# Patient Record
Sex: Female | Born: 1945
Health system: Southern US, Community
[De-identification: ages and names within clinical notes are randomized; demographics above are authoritative.]

## PROBLEM LIST (undated history)

## (undated) DIAGNOSIS — G4730 Sleep apnea, unspecified: Secondary | ICD-10-CM

## (undated) DIAGNOSIS — C4491 Basal cell carcinoma of skin, unspecified: Secondary | ICD-10-CM

## (undated) DIAGNOSIS — R112 Nausea with vomiting, unspecified: Secondary | ICD-10-CM

## (undated) DIAGNOSIS — L570 Actinic keratosis: Secondary | ICD-10-CM

## (undated) DIAGNOSIS — I471 Supraventricular tachycardia, unspecified: Secondary | ICD-10-CM

## (undated) DIAGNOSIS — Z9889 Other specified postprocedural states: Secondary | ICD-10-CM

## (undated) DIAGNOSIS — T753XXA Motion sickness, initial encounter: Secondary | ICD-10-CM

## (undated) DIAGNOSIS — I10 Essential (primary) hypertension: Secondary | ICD-10-CM

## (undated) HISTORY — DX: Actinic keratosis: L57.0

## (undated) HISTORY — DX: Basal cell carcinoma of skin, unspecified: C44.91

## (undated) HISTORY — PX: COLONOSCOPY: SHX174

## (undated) HISTORY — PX: HAND SURGERY: SHX662

## (undated) HISTORY — PX: FOOT SURGERY: SHX648

---

## 1999-11-20 ENCOUNTER — Encounter (INDEPENDENT_AMBULATORY_CARE_PROVIDER_SITE_OTHER): Payer: Self-pay

## 1999-11-20 ENCOUNTER — Other Ambulatory Visit: Admission: RE | Admit: 1999-11-20 | Discharge: 1999-11-20 | Payer: Self-pay | Admitting: Obstetrics and Gynecology

## 2008-03-09 ENCOUNTER — Ambulatory Visit: Payer: Self-pay | Admitting: Internal Medicine

## 2008-03-23 ENCOUNTER — Ambulatory Visit: Payer: Self-pay | Admitting: Internal Medicine

## 2010-06-25 NOTE — Miscellaneous (Signed)
Summary: [facility] Previsit/prep  Clinical Lists Changes  Medications: Added new medication of DULCOLAX 5 MG  TBEC (BISACODYL) Day before procedure take 2 at 3pm and 2 at 8pm. - Signed Added new medication of METOCLOPRAMIDE HCL 10 MG  TABS (METOCLOPRAMIDE HCL) As per prep instructions. - Signed Added new medication of MIRALAX   POWD (POLYETHYLENE GLYCOL 3350) As per prep  instructions. - Signed Rx of DULCOLAX 5 MG  TBEC (BISACODYL) Day before procedure take 2 at 3pm and 2 at 8pm.;  #4 x 0;  Signed;  Entered by: Wyona Almas RN;  Authorized by: Hart Carwin MD;  Method used: Electronically to [facility]*, [address] [place], [place], [place]  [address], Ph: [phone], Fax: [phone] Rx of METOCLOPRAMIDE HCL 10 MG  TABS (METOCLOPRAMIDE HCL) As per prep instructions.;  #2 x 0;  Signed;  Entered by: Wyona Almas RN;  Authorized by: Hart Carwin MD;  Method used: Electronically to [facility]*, [address] [place], [place], [place]  [address], Ph: [phone], Fax: [phone] Rx of MIRALAX   POWD (POLYETHYLENE GLYCOL 3350) As per prep  instructions.;  #255gm x 0;  Signed;  Entered by: Wyona Almas RN;  Authorized by: Hart Carwin MD;  Method used: Electronically to [facility]*, [address] [place], [place], [place]  [address], Ph: [phone], Fax: [phone] Allergies: Added new allergy or adverse reaction of PENICILLIN Observations: Added new observation of NKA: F (03/09/2008 14:53)    Prescriptions: MIRALAX   POWD (POLYETHYLENE GLYCOL 3350) As per prep  instructions.  #255gm x 0   Entered by:   Wyona Almas RN   Authorized by:   Hart Carwin MD   Signed by:   Wyona Almas RN on 03/09/2008   Method used:   Electronically to        [facility]* (retail)       [address]       [place], [place]  [address]       Ph: [phone]       Fax: [phone]   RxID:   [id number] METOCLOPRAMIDE HCL 10 MG  TABS  (METOCLOPRAMIDE HCL) As per prep instructions.  #2 x 0   Entered by:   Wyona Almas RN   Authorized by:   Hart Carwin MD   Signed by:   Wyona Almas RN on 03/09/2008   Method used:   Electronically to        [facility]* (retail)       [address]       [place], [place]  [address]       Ph: [phone]       Fax: [phone]   RxID:   [id number] DULCOLAX 5 MG  TBEC (BISACODYL) Day before procedure take 2 at 3pm and 2 at 8pm.  #4 x 0   Entered by:   Wyona Almas RN   Authorized by:   Hart Carwin MD   Signed by:   Wyona Almas RN on 03/09/2008   Method used:   Electronically to        [facility]* (retail)       [address]       [place], [place]  [address]       Ph: [phone]       Fax: [phone]   RxID:   [id number]

## 2012-05-27 ENCOUNTER — Emergency Department: Payer: Self-pay | Admitting: Emergency Medicine

## 2012-05-27 LAB — COMPREHENSIVE METABOLIC PANEL
Albumin: 4 g/dL (ref 3.4–5.0)
Anion Gap: 8 (ref 7–16)
Bilirubin,Total: 0.2 mg/dL (ref 0.2–1.0)
Calcium, Total: 8.6 mg/dL (ref 8.5–10.1)
Chloride: 107 mmol/L (ref 98–107)
Co2: 27 mmol/L (ref 21–32)
Creatinine: 0.76 mg/dL (ref 0.60–1.30)
Glucose: 99 mg/dL (ref 65–99)
Osmolality: 284 (ref 275–301)
SGOT(AST): 22 U/L (ref 15–37)
SGPT (ALT): 25 U/L (ref 12–78)
Sodium: 142 mmol/L (ref 136–145)

## 2012-05-27 LAB — TROPONIN I: Troponin-I: <0.02 ng/mL

## 2012-05-27 LAB — CBC
HCT: 42.2 % (ref 35.0–47.0)
HGB: 13.8 g/dL (ref 12.0–16.0)
MCHC: 32.8 g/dL (ref 32.0–36.0)
Platelet: 244 10*3/uL (ref 150–440)

## 2013-12-13 ENCOUNTER — Ambulatory Visit (INDEPENDENT_AMBULATORY_CARE_PROVIDER_SITE_OTHER): Payer: 59 | Admitting: Sports Medicine

## 2013-12-13 ENCOUNTER — Encounter: Payer: Self-pay | Admitting: Sports Medicine

## 2013-12-13 VITALS — BP 131/75 | HR 56 | Ht 68.0 in | Wt 150.0 lb

## 2013-12-13 DIAGNOSIS — S62609A Fracture of unspecified phalanx of unspecified finger, initial encounter for closed fracture: Secondary | ICD-10-CM

## 2013-12-13 DIAGNOSIS — M7542 Impingement syndrome of left shoulder: Secondary | ICD-10-CM

## 2013-12-13 DIAGNOSIS — M719 Bursopathy, unspecified: Secondary | ICD-10-CM

## 2013-12-13 DIAGNOSIS — M67919 Unspecified disorder of synovium and tendon, unspecified shoulder: Secondary | ICD-10-CM

## 2013-12-13 MED ORDER — NITROGLYCERIN 0.2 MG/HR TD PT24: MEDICATED_PATCH | TRANSDERMAL | Status: DC

## 2013-12-13 NOTE — Patient Instructions (Signed)

## 2013-12-13 NOTE — Progress Notes (Signed)
  Shelley Sims - 68 y.o. female MRN [id number]  Date of birth: 05/02/1946  SUBJECTIVE:  Including CC & ROS.  Patient is a 68 year old Caucasian female an avid tennis player who presents today complaining of chronic left shoulder pain. Patient reports a history that about 4 years ago she started having left shoulder pain. She was seen by orthopedic surgery who felt that she had a rotator cuff tendinitis put her in physical therapy. She continued aggressive home program with icing regularly which significantly improved her symptoms. However about 4 months in March another flareup of left shoulder pain started. She was seen by orthopedic surgery for reevaluated and they reported moderate osteoarthritis of the shoulder and a small tear was seen on an MRI of the left shoulder. At this time she was having pain at night. She rested by avoiding aggressive tennis and does feel that her pain is improved but this is still a nagging issue that she would like to have further management. Mentally patient would like to return to regular tennis at least 2-3 times a week  Also complicating patient's history she was resent at the beach week and suffered a fall while walking to the pool. She went down on her knees which reports has some bruising and discomfort and also fell on her hands and received some blunt trauma to her left index finger. She was seen in urgent care xray of her left knee was negative, and  the x-ray of her left hand revealed a distal phalanx intra-articular fracture. Patient was placed in a splint and recommended followup x-rays in fracture management   ROS: Review of systems otherwise negative except for information present in HPI  HISTORY: Past Medical, Surgical, Social, and Family History Reviewed & Updated per EMR. Pertinent Historical Findings include: Avid tennis player, straight hypertension, no history of migraine  DATA REVIEWED: Agents recent MRI of the left shoulder done in March 2015 revealed  small partial tear to the supraspinatus, no significant osteoarthritis  PHYSICAL EXAM:  VS: BP:131/75 mmHg  HR:56bpm  TEMP: ( )  RESP:   HT:5\' 8"  (172.7 cm)   WT:150 lb (68.04 kg)  BMI:22.9 PHYSICAL EXAM: SHOULDER EXAM:  General: well nourished Skin of UE: warm; dry, no rashes, lesions, ecchymosis or erythema. Vascular: radial pulses 2+ bilaterally Neurologically: Sensation to light touch upper extremities equal and intact bilaterally. Normal sensation with no sensory or motor defects in C4-C8 Palpation: no tenderness over the [facility] joint, acromion, no bicipital grove tenderness, mild supraspinatus tenderness at the anterior shoulder ROM active/passive: symmetric full 180 degree of abduction and forward flexion, symmetric internal (80-90) and external rotation (90) with shoulder at 90 abduction. Appley's scratch test equal bilaterally Strength testing: 5/5 symmetric strength in internal and external rotation, forward flexion, adduction and abduction     Special Test: positive Neer's, positive Hawkins, positive empty can, neg O'Brien, neg speeds, negative yergenson  MSK [place]: Left shoulder revealed normal biceps tendon, normal subscapularis, hypoechoic changes within the distal anterior fibers of the supraspinatus at the insertion, normal teres minor and infraspinatus. Articular cartilage surfaces appear smooth with no significant changes  ASSESSMENT & PLAN: See problem based charting & AVS for pt instructions.

## 2013-12-13 NOTE — Assessment & Plan Note (Signed)
Based on the patient's history of long-standing rotator cuff tendinitis and avid tennis playing she is at risk for rotator cuff tendinitis as well as possible tears. Based on her MRI, ultrasound and physical exam today there is evidence of possible impingement to the supraspinatus as well as evidence of hypoechoic changes on ultrasound at the distal anterior fibers as they insert on the humerus. She does not have any significant arthritic changes within the shoulder. Recommend conservative management with aggressive Jobbs protocol with range of motion and strengthening exercises. Will also treat patient with nitroglycerin patches to help with healing of this chronic tendinopathy. Patient will followup in 6 weeks in the office to reevaluate her shoulder strength and pain, as well as ultrasound patient to see if there is still hypoechoic changes within this supraspinatus

## 2013-12-13 NOTE — Assessment & Plan Note (Signed)
Unable to personally review the patient's x-ray of her left index finger that was done last week. Based on patient's x-ray reports she has a distal phalanx intra-articular fracture. Recommended continuing wearing the splint continuously for the next 3 weeks. We'll readmit his left hand at the beginning of August to determine if there is good healing quality and when we can discontinue the splint at that time. Will followup the patient over the phone concerning fracture management at that time the x-rays done

## 2014-01-06 ENCOUNTER — Ambulatory Visit
Admission: RE | Admit: 2014-01-06 | Discharge: 2014-01-06 | Disposition: A | Discharge status: Home / Self Care | Payer: Medicare Other | Source: Ambulatory Visit | Attending: Sports Medicine | Admitting: Sports Medicine

## 2014-01-06 DIAGNOSIS — S62609A Fracture of unspecified phalanx of unspecified finger, initial encounter for closed fracture: Secondary | ICD-10-CM

## 2014-01-25 ENCOUNTER — Encounter: Payer: Self-pay | Admitting: Sports Medicine

## 2014-01-25 ENCOUNTER — Ambulatory Visit (INDEPENDENT_AMBULATORY_CARE_PROVIDER_SITE_OTHER): Payer: Medicare Other | Admitting: Sports Medicine

## 2014-01-25 VITALS — BP 119/60 | Ht 68.0 in | Wt 150.0 lb

## 2014-01-25 DIAGNOSIS — M7542 Impingement syndrome of left shoulder: Secondary | ICD-10-CM

## 2014-01-25 DIAGNOSIS — M67919 Unspecified disorder of synovium and tendon, unspecified shoulder: Secondary | ICD-10-CM

## 2014-01-25 DIAGNOSIS — M62838 Other muscle spasm: Secondary | ICD-10-CM | POA: Insufficient documentation

## 2014-01-25 DIAGNOSIS — M719 Bursopathy, unspecified: Secondary | ICD-10-CM

## 2014-01-25 DIAGNOSIS — S62609A Fracture of unspecified phalanx of unspecified finger, initial encounter for closed fracture: Secondary | ICD-10-CM

## 2014-01-25 MED ORDER — CYCLOBENZAPRINE HCL 5 MG PO TABS: 5.0000 mg | ORAL_TABLET | Freq: Three times a day (TID) | ORAL | Status: DC | PRN

## 2014-01-25 NOTE — Progress Notes (Signed)
   Subjective:    Patient ID: Shelley Sims, female    DOB: 10/18/1945, 68 y.o.   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 For her shoulder pain, she was diagnosed with a supraspinatus tear 6 weeks ago, and has been using the nitroglycerin patch, and performing home exercise. Her shoulder symptoms have completely resolved today. She denies any aggravating factors. She has not yet returned to tennis activity. For the left index finger she notices significant decrease in her level of pain, and notes primarily just stiffness in the DIP joint and some associated residual swelling. She denies any numbness, tingling, or weakness. For the right midthoracic pain, onset of symptoms was approximately one week ago. She denies any known acute injury, and notes that it just started hurting when she woke up. Location is right paraspinal region in the mid thoracic spine. She denies any radiation of the symptoms. There is aggravated with bending her head down, twisting, or turning. Character of the pain is a pulling sensation. Symptoms are relieved with heat and ice. She denies any associated fevers, chills, numbness, tingling, or weakness.  Family history, social history, past medical history, medications, and allergies were reviewed in the EHR and are up-to-date.  Review of Systems As per history of present illness, 11 point review of systems was performed and is otherwise negative.    Objective:   Physical Exam BP 119/60  Ht 5\' 8"  (1.727 m)  Wt 150 lb (68.04 kg)  BMI 22.81 kg/m2 GEN: The patient is well-developed well-nourished female and in no acute distress.  She is awake alert and oriented x3. SKIN: warm and well-perfused, no rash  Neuro: Strength 5/5 globally. Sensation intact throughout. DTRs 2/4 bilaterally. No focal deficits. Vasc: +2 bilateral distal pulses. No edema.    MSK:  Examination of the left shoulder Full pain-free range of motion in all planes. Negative Hawkins test. She has good rotator cuff muscle strength. Negative Jobes test. Negative abdominal compression test. No tenderness over the a.c. joint. Negative speeds test.  Examination of the left index finger reveals some mild DIP swelling. Intrinsic flexion and extension is intact at the joint. She is no tenderness and minimal pain with passive flexion and extension. There is no overlying warmth or induration.  Examination of the thoracic spine reveals right paraspinal muscle hypertonicity with limited range of motion. No bony tenderness.  Limited musculoskeletal ultrasound: Limited musculoskeletal exam of the left shoulder was performed with views taken in the long and short axis. Examination of the patient's supraspinatus tendon reveals almost completely resolved intrinsic supraspinatus tear. There no fluid densities within the body of the supraspinatus muscle or tendon. Its insertion appears intact. The biceps, infraspinatus, and teres minor, and subscapularis appear normal.   Assessment & Plan:  1. left shoulder rotator cuff tear, improving -For the left shoulder, increase wt to 5lbs for exercises. Do 3 sets of 15 each exercise. -For the first 2 weeks, start hitting drills -Then for 4 weeks, continue to use caution with hitting serves to keep from over-doing it on the shoulder -Then at 6 weeks, discontinue nitroglycerin patch. - f/u prn  2. Right mid thoracic muscle spasm -Rx flexeril 5mg  po tid with precautions -Stretch, heating pad, follow-up with chiropracter tomorrow -Follow-up if persists  3. Left index finger DIP fracture -Improving -Continue stretching

## 2014-01-25 NOTE — Assessment & Plan Note (Signed)
-  For the left shoulder, increase wt to 5lbs for exercises. Do 3 sets of 15 each exercise. -For the first 2 weeks, start hitting drills -Then for 4 weeks, continue to use caution with hitting serves to keep from over-doing it on the shoulder -Then at 6 weeks, discontinue nitroglycerin patch. - f/u prn

## 2014-01-25 NOTE — Patient Instructions (Signed)
-  Take small dose of muscle relaxant as needed, starting at bedtime -Apply heat and stretch as needd.  -For the left shoulder, increase wt to 5lbs for exercises. Do 3 sets of 15 each exercise. -For the first 2 weeks, start hitting drills -Then for 4 weeks, continue to use caution with hitting serves to keep from over-doing it on the shoulder -Then at 6 weeks, discontinue nitroglycerin patch.  -Follow-up if needed

## 2014-01-25 NOTE — Assessment & Plan Note (Signed)
-  Improving  -Continue stretching

## 2014-01-25 NOTE — Assessment & Plan Note (Signed)
-  Rx flexeril 5mg  po tid with precautions -Stretch, heating pad, follow-up with chiropracter tomorrow -Follow-up if persists

## 2014-06-08 ENCOUNTER — Other Ambulatory Visit: Payer: Medicare Other | Admitting: Sports Medicine

## 2014-07-05 ENCOUNTER — Encounter: Payer: Self-pay | Admitting: Sports Medicine

## 2014-07-05 ENCOUNTER — Ambulatory Visit (INDEPENDENT_AMBULATORY_CARE_PROVIDER_SITE_OTHER): Payer: Medicare Other | Admitting: Sports Medicine

## 2014-07-05 VITALS — BP 125/81 | HR 60 | Ht 68.0 in | Wt 153.0 lb

## 2014-07-05 DIAGNOSIS — M75102 Unspecified rotator cuff tear or rupture of left shoulder, not specified as traumatic: Secondary | ICD-10-CM | POA: Diagnosis not present

## 2014-07-05 DIAGNOSIS — M7542 Impingement syndrome of left shoulder: Secondary | ICD-10-CM

## 2014-07-05 MED ORDER — NITROGLYCERIN 0.2 MG/HR TD PT24: MEDICATED_PATCH | TRANSDERMAL | Status: DC

## 2014-07-05 NOTE — Progress Notes (Signed)
   Subjective:    Patient ID: Shelley Sims, female    DOB: 1945-12-17, 69 y.o.   MRN: [id number]  HPI Patient is a 69 year old left-hand-dominant female an avid tennis player who presents today with recurrent left shoulder pain. About one year ago, she was seen by orthopedic surgery and they reported moderate osteoarthritis of the shoulder and a small tear was seen on an MRI of the left shoulder. We had been seeing her in July and September 2015 for follow-up of a small partial thickness supraspinatus tear in the left shoulder, treated with nitroglycerin and a home exercise program. Her symptoms had resolved at her follow-up appointment in September and the ultrasound appeared improved.  She read aggravated the left shoulder picking up luggage when she went on a trip to [place] and the [place] in October or November. She complains of intermittent left shoulder pain. She tried restarting the nitroglycerin patch for about 10 days, which was about a month ago. She has continued to play tennis 3 times per week. She denies any numbness, tingling, or weakness. Location of pain is primarily superior lateral left shoulder. She denies any pain that wakes her up at night. She is not taking any other medication for this.  Past medical history, social history, medications, and allergies were reviewed and are up to date in the chart. Review of Systems 7 point review of systems was performed and was otherwise negative unless noted in the history of present illness.     Objective:   Physical Exam BP 125/81 mmHg  Pulse 60  Ht 5\' 8"  (1.727 m)  Wt 153 lb (69.4 kg)  BMI 23.27 kg/m2 GEN: The patient is well-developed well-nourished female and in no acute distress.  She is awake alert and oriented x3. SKIN: warm and well-perfused, no rash  Neuro: Strength 5/5 globally. Sensation intact throughout. No focal deficits. Vasc: +2 bilateral distal pulses. No edema.  MSK: Examination of the back reveals no scapular  protraction with dynamic testing. She has full shoulder range of motion with minimal pain. Negative liftoff test. She has good rotator cuff strength. Slightly positive speed's test mainly just for pain. Negative Hawkins. Negative speeds. Negative Yergason's. No tenderness at the acromioclavicular joint. No apparent atrophy.  Limited musculoskeletal ultrasound: Long and short axis views were obtained of the left shoulder which is significant for a small partial thickness rotator cuff tear in the anterior portion of the supraspinatus involving approximately 15-20% of the foot plate. There is mild surrounding hypoechoic change, suggestive of fluid. The biceps, subscapularis, teres minor, and infraspinatus appear normal.    Assessment & Plan:  Please see problem based assessment and plan in the problem list.

## 2014-07-05 NOTE — Assessment & Plan Note (Signed)
-  Re-start NTG protocol -Modify serve to strike at 10:00 position rather than 12:00. Make sure to strike ball in front of your shoulder, not behind. -Re-start rotator cuff strengthening exercises -Plan follow-up in about 6-8 weeks if having persistent pain -Continue NTG patch for about 12 weeks, then may discontinue.

## 2014-09-26 ENCOUNTER — Ambulatory Visit: Payer: Self-pay | Admitting: Podiatry

## 2014-10-03 ENCOUNTER — Ambulatory Visit: Payer: Self-pay | Admitting: Podiatry

## 2014-11-22 ENCOUNTER — Ambulatory Visit: Payer: Medicare Other | Admitting: Sports Medicine

## 2014-11-22 ENCOUNTER — Ambulatory Visit (INDEPENDENT_AMBULATORY_CARE_PROVIDER_SITE_OTHER): Payer: Medicare Other | Admitting: Sports Medicine

## 2014-11-22 ENCOUNTER — Encounter: Payer: Self-pay | Admitting: Sports Medicine

## 2014-11-22 VITALS — BP 160/71 | HR 68 | Ht 68.0 in | Wt 150.0 lb

## 2014-11-22 DIAGNOSIS — R269 Unspecified abnormalities of gait and mobility: Secondary | ICD-10-CM | POA: Diagnosis not present

## 2014-11-22 DIAGNOSIS — M722 Plantar fascial fibromatosis: Secondary | ICD-10-CM | POA: Diagnosis not present

## 2014-11-22 DIAGNOSIS — M533 Sacrococcygeal disorders, not elsewhere classified: Secondary | ICD-10-CM | POA: Diagnosis not present

## 2014-11-22 NOTE — Patient Instructions (Signed)
Recommend working with SI joint with rehab exercises. -Pilates rehabilitation to work on mobilizing right SI joint. -Pelvic rotation  -Hip abductor strengthening  1. Hip abductor strengthening: Laying on side with leg straight, raise leg up and back down, 10-15 x 3 sets once daily, both sides  2. Crossover stretch: Pull right leg over left laying on your back, hold 5-10 seconds, breath, relax, repeat 5 times daily  3. Laying on your back, pull knees to chest, breath, hold 5 seconds, repeat 3 times  4. Standing hip rotation exercises: hands on hips, flex right hip to 90 degrees, rotate hip out, bring back to neutral. Repeat 8-10 x 3 sets.  Follow-up in a month or two to track progress and plan on bringing insoles with athletic shoes to look at inserts.

## 2014-11-22 NOTE — Progress Notes (Signed)
   Subjective:    Patient ID: Shelley Sims, female    DOB: [birth year], 69 y.o.   MRN: [id number]  HPI Shelley Sims is a 69 year old female who presents for evaluation of right-sided low back pain and right knee pain. Onset was 6 months ago without any known acute injury. She is fairly active with playing tennis. She is left-handed. Location of her pain is located directly over the right sacroiliac joint. She feels that sometimes this pain radiates down the posterolateral right thigh to the lateral right knee. Symptoms are aggravated with playing tennis or other activities. She has not tried any relieving factors. Her pain occasionally irritates her at night. Location of the knee pain is primarily the lateral right knee. She notes associated intermittent crepitus with pain. She denies any swelling, locking, or giving way. She feels that the right knee has a weak sensation to it. She denies any pain that radiates past the knee. She denies any loss of bladder bowel or saddle anesthesia.  Recall that she has a somewhat rather complex foot history with prior visits to [facility]. See previous documentation for more detail. Unfortunately, she did not bring in her athletic shoes and orthotics today.  Past medical history, social history, medications, and allergies were reviewed and are up to date in the chart.  Review of Systems 7 point review of systems was performed and was otherwise negative unless noted in the history of present illness.     Objective:   Physical Exam BP 160/71 mmHg  Pulse 68  Ht 5\' 8"  (1.727 m)  Wt 150 lb (68.04 kg)  BMI 22.81 kg/m2 GEN: The patient is well-developed well-nourished female and in no acute distress.  She is awake alert and oriented x3. SKIN: warm and well-perfused, no rash  EXTR: No lower extremity edema or calf tenderness Neuro: Strength 5/5 globally. Sensation intact throughout. DTRs 2/4 bilaterally. No focal deficits. Vasc: +2 bilateral distal pulses. No edema.  MSK:  Examination of the lumbar spine reveals grossly full range of motion. She points to an area of tenderness around the right SI joint. This is tender to palpation. She has decreased mobility at the joint. Negative seated straight leg raise test. Examination of the right knee reveals no effusion. No overlying erythema or induration. Full range of motion. Tender to palpation along the distal IT band. Range of motion with mild crepitus without pain. No valgus varus instability. She has weakness of the bilateral gluteus medius hip abductors. Gait analysis reveals that she walks with a slightly greater right-sided Trendelenburg compared to left. No leg length discrepancy. Examination of the bilateral feet reveals pes cavus, worse on the right. She has a midfoot varus on the right. She has splaying between the bilateral first and second toes.     Assessment & Plan:  Please see problem based assessment and plan in the problem list.

## 2014-11-22 NOTE — Assessment & Plan Note (Addendum)
-  Stretches, hip strengthening -Pilates rehabilitation -Follow-up if needed

## 2014-11-22 NOTE — Assessment & Plan Note (Addendum)
-  Tried CST injection in the past without success -Has insoles -Try manual massage to break up adhesions We'll do some research if Botox injections have yielded some good results.

## 2014-11-22 NOTE — Assessment & Plan Note (Signed)
-  Hip abductor strengthening -Piriformis stretches and gapping at the SI joint -We'll plan on seeing her back to evaluate her orthotics at some point

## 2015-01-09 ENCOUNTER — Ambulatory Visit (INDEPENDENT_AMBULATORY_CARE_PROVIDER_SITE_OTHER): Payer: Medicare Other | Admitting: Podiatry

## 2015-01-09 ENCOUNTER — Encounter: Payer: Self-pay | Admitting: Podiatry

## 2015-01-09 ENCOUNTER — Ambulatory Visit (INDEPENDENT_AMBULATORY_CARE_PROVIDER_SITE_OTHER): Payer: Medicare Other

## 2015-01-09 VITALS — BP 136/78 | HR 72 | Resp 18

## 2015-01-09 DIAGNOSIS — R52 Pain, unspecified: Secondary | ICD-10-CM

## 2015-01-09 DIAGNOSIS — M722 Plantar fascial fibromatosis: Secondary | ICD-10-CM | POA: Diagnosis not present

## 2015-01-09 NOTE — Progress Notes (Signed)
   Subjective:    Patient ID: Shelley Sims, female    DOB: 05-15-1946, 69 y.o.   MRN: [id number]  HPI  69 year old female presents the office with complaints of multiple knots the bottoms of feet placement ongoing for approximately 35 years. She states that she feels had surgery on the right foot although the knots didn't recur. She has some occasional discomfort to them after being on them for quite some time and see some redness she is on them for a long time as well. She states they have been about the same size over the last several years then made growing slowly. She previously had orthotics, injections. She states that she does have any relief after the injections. No other complaints at this time. Review of Systems  All other systems reviewed and are negative.      Objective:   Physical Exam AAO x3, NAD DP/PT pulses palpable bilaterally, CRT less than 3 seconds Protective sensation intact with Simms Weinstein monofilament, vibratory sensation intact, Achilles tendon reflex intact On the plantar aspect of bilateral feet there are multiple, firm, nonmobile nodules within the medial band of the plantar fascia. On the left foot there is multiple nodules one and medial forefoot mild medial arch on the right foot on the medial arch. There is no overlying skin change. There is no tenderness at this time to palpation of lesions. No other areas of tenderness to bilateral lower extremities. MMT 5/5, ROM WNL.  No open lesions or pre-ulcerative lesions.  No overlying edema, erythema, increase in warmth to bilateral lower extremities.  No pain with calf compression, swelling, warmth, erythema bilaterally.      Assessment & Plan:  69 year old female with multiple bilateral plantar fibromas -X-rays were obtained and reviewed with the patient.  -Treatment options discussed including all alternatives, risks, and complications -At this time discussed various conservative treatment options. She elected  to proceed with verapamil cream. This is ordered through a compound pharmacy. Application instructions in side effects were discussed the patient. -Also discussed new orthotics. She'll try the cream to see this helps before buying new orthotics. -Follow-up in 3 months or sooner if any problems arise. In the meantime, encouraged to call the office with any questions, concerns, change in symptoms.   Celesta Gentile, DPM

## 2015-03-08 ENCOUNTER — Ambulatory Visit (INDEPENDENT_AMBULATORY_CARE_PROVIDER_SITE_OTHER): Payer: Medicare Other | Admitting: Podiatry

## 2015-03-08 ENCOUNTER — Encounter: Payer: Self-pay | Admitting: Podiatry

## 2015-03-08 VITALS — BP 149/75 | HR 65 | Resp 18

## 2015-03-08 DIAGNOSIS — M722 Plantar fascial fibromatosis: Secondary | ICD-10-CM | POA: Diagnosis not present

## 2015-03-08 DIAGNOSIS — M7662 Achilles tendinitis, left leg: Secondary | ICD-10-CM | POA: Diagnosis not present

## 2015-03-08 NOTE — Patient Instructions (Signed)

## 2015-03-10 NOTE — Progress Notes (Signed)
Patient ID: Shelley Sims, female   DOB: Aug 05, 1945, 69 y.o.   MRN: [id number]  Subjective: Patient presents the office they follow up evaluation of plantar fibromas. Also she states that she forgot to mention her last appointment that she was having some tenderness of the back of her left heel which started a couple months ago. She says the pain has gotten better although she has pain with prolonged ambulation or weightbearing. She stated the area feels tight and she stretches the area out. She continues with the compound cream to the growths on the bottom of the feet and she believes that it is working. She denies any redness or swelling. Denies any recent injury or trauma to bilateral lower extremities. No tingling or numbness. No other complaints at this time.  Objective:  AAO 3, NAD DP/PT pulses palpable 2/4, CRT less than 3 seconds Protective sensation intact with Simms Weinstein monofilament On the plantar aspect of the bilateral feet are multiple, firm, not mobile nodules along the medial band of the plantar fascia. The nodules do appear to be smaller sized they appear to be more supple. On the left posterior heel there is tenderness to palpation on the distal aspect of the Achilles tendon and along the posterior aspect of the calcaneus. There is no defect noted in Spaulding test is negative within the Achilles tendon. There is no pain with lateral compression of the calcaneus. Equinus is present. There is no overlying edema, erythema, increase in warmth. No open lesions or pre-ulcer lesions. No pain with calf compression, swelling, warmth, erythema.  Assessment: 69 year old female with left Achilles tendinitis, plantar fibromatosis  Plan: -Treatment options discussed including all alternatives, risks, and complications -Continue with compound cream/verapamil cream to the plantar fibromas. -Stretching and rehabilitation exercises for Achilles tendinitis were dispensed and discussed today. Ice  to the area. I discussed night splint she states that she will purchase one over-the-counter due to cost. Continue supportive shoe gear. -Follow-up as scheduled or sooner if any problems are to arise. Call any questions or concerns or any changes symptoms in the meantime.  Celesta Gentile, DPM

## 2015-04-12 ENCOUNTER — Ambulatory Visit: Payer: Medicare Other | Admitting: Podiatry

## 2015-05-30 DIAGNOSIS — R05 Cough: Secondary | ICD-10-CM | POA: Diagnosis not present

## 2015-05-30 DIAGNOSIS — J029 Acute pharyngitis, unspecified: Secondary | ICD-10-CM | POA: Diagnosis not present

## 2015-06-12 ENCOUNTER — Ambulatory Visit: Payer: Medicare Other | Admitting: Podiatry

## 2015-07-25 ENCOUNTER — Ambulatory Visit (INDEPENDENT_AMBULATORY_CARE_PROVIDER_SITE_OTHER): Payer: PPO | Admitting: Sports Medicine

## 2015-07-25 ENCOUNTER — Encounter: Payer: Self-pay | Admitting: Sports Medicine

## 2015-07-25 VITALS — BP 141/79 | HR 66 | Ht 67.0 in | Wt 154.0 lb

## 2015-07-25 DIAGNOSIS — M722 Plantar fascial fibromatosis: Secondary | ICD-10-CM

## 2015-07-25 DIAGNOSIS — M7542 Impingement syndrome of left shoulder: Secondary | ICD-10-CM

## 2015-07-25 DIAGNOSIS — M75102 Unspecified rotator cuff tear or rupture of left shoulder, not specified as traumatic: Secondary | ICD-10-CM

## 2015-07-26 NOTE — Progress Notes (Signed)
   Subjective:    Patient ID: Shelley Sims, female    DOB: Feb 11, 1946, 70 y.o.   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  - Over last few months has having increasing pain again.  - Tried NTG, 1/2 patch,  for 4-5 days with good improvement in pain. - Only able to play every other day. - Takes an occasional IBU.  - No falls or other trauma.   - No fevers, chills, or night sweats.    Plantar Fibromatosis:  - Chronic - Usually doesn't bother her much when walking/playing tennis.  - Sore after working out usually.  - Tried breaking up adhesions with 'spikey ball', but very painful.  - Injections and surgery in the past.  - Currently usuing rigid orthotics that she has had for 5+ years.  - Does have a cream of baclofen, gabapentin, diclofenac that potentially makes things better.    Family history, social history, past medical history, medications, and allergies were reviewed in the EHR and are up-to-date.  Review of Systems As per history of present illness, 11 point review of systems was performed and is otherwise negative.    Objective:   Physical Exam BP 141/79 mmHg  Pulse 66  Ht 5\' 7"  (1.702 m)  Wt 154 lb (69.854 kg)  BMI 24.11 kg/m2 GEN: The patient is well-developed well-nourished female and in no acute distress.  She is awake alert and oriented x3. SKIN: warm and well-perfused, no rash  Neuro: Strength 5/5 globally. Sensation intact throughout. DTRs 2/4 bilaterally. No focal deficits. Vasc: +2 bilateral distal pulses. No edema.  MSK:  Examination of the left shoulder Full pain-free range of motion in all planes. + Hawkins test. She has good rotator cuff muscle strength. Negative Jobes test. Negative abdominal compression test. No tenderness over the a.c. joint. Negative speeds test.  Foot exam: -  Numerous plantar fibromas b/l.  - b/l pes cavus, R>L - Moderate transvers arch collapse  Limited musculoskeletal ultrasound: Limited musculoskeletal exam of the left shoulder was performed with views taken in the long and short axis. Examination of the patient's supraspinatus tendon reveals a persistnet articular surface irredularity and early calcific tendinopathy on the interval view.  Its insertion appears intact. The biceps, infraspinatus, and teres minor, and subscapularis appear normal. C/W chronic supra impingement and tendinopathy   Assessment & Plan:  1. left shoulder rotator cuff Impingement/tendinopathy.  - Continue with RC strengthening - NTG PRN. - Change serve position to 10 o'clock from noon to reduce supra impingement.  - f/u prn, 6-8 weeks if not improving.   2. B/l Plantar fibromas: rigid orthotic.  Will have her return for orthotic construction as the softer material will hopefully feel better for her. If providing some relief, continue with spikey ball, stretching, ice water baths post workout. Can consider injection as well, which can help decrease size.

## 2015-08-15 ENCOUNTER — Encounter: Payer: PPO | Admitting: Sports Medicine

## 2015-08-21 DIAGNOSIS — Z1231 Encounter for screening mammogram for malignant neoplasm of breast: Secondary | ICD-10-CM | POA: Diagnosis not present

## 2015-09-17 DIAGNOSIS — L400 Psoriasis vulgaris: Secondary | ICD-10-CM | POA: Diagnosis not present

## 2015-09-17 DIAGNOSIS — Z1283 Encounter for screening for malignant neoplasm of skin: Secondary | ICD-10-CM | POA: Diagnosis not present

## 2015-09-17 DIAGNOSIS — D485 Neoplasm of uncertain behavior of skin: Secondary | ICD-10-CM | POA: Diagnosis not present

## 2015-09-17 DIAGNOSIS — L821 Other seborrheic keratosis: Secondary | ICD-10-CM | POA: Diagnosis not present

## 2015-09-17 DIAGNOSIS — L812 Freckles: Secondary | ICD-10-CM | POA: Diagnosis not present

## 2015-09-17 DIAGNOSIS — L578 Other skin changes due to chronic exposure to nonionizing radiation: Secondary | ICD-10-CM | POA: Diagnosis not present

## 2015-09-17 DIAGNOSIS — L918 Other hypertrophic disorders of the skin: Secondary | ICD-10-CM | POA: Diagnosis not present

## 2015-09-17 DIAGNOSIS — D229 Melanocytic nevi, unspecified: Secondary | ICD-10-CM | POA: Diagnosis not present

## 2015-09-17 DIAGNOSIS — D1801 Hemangioma of skin and subcutaneous tissue: Secondary | ICD-10-CM | POA: Diagnosis not present

## 2015-09-17 DIAGNOSIS — Z808 Family history of malignant neoplasm of other organs or systems: Secondary | ICD-10-CM | POA: Diagnosis not present

## 2015-09-21 DIAGNOSIS — E785 Hyperlipidemia, unspecified: Secondary | ICD-10-CM | POA: Diagnosis not present

## 2015-09-21 DIAGNOSIS — E559 Vitamin D deficiency, unspecified: Secondary | ICD-10-CM | POA: Diagnosis not present

## 2015-09-27 DIAGNOSIS — R8299 Other abnormal findings in urine: Secondary | ICD-10-CM | POA: Diagnosis not present

## 2015-09-27 DIAGNOSIS — Z23 Encounter for immunization: Secondary | ICD-10-CM | POA: Diagnosis not present

## 2015-09-27 DIAGNOSIS — I10 Essential (primary) hypertension: Secondary | ICD-10-CM | POA: Diagnosis not present

## 2015-09-27 DIAGNOSIS — Z0000 Encounter for general adult medical examination without abnormal findings: Secondary | ICD-10-CM | POA: Diagnosis not present

## 2015-09-27 DIAGNOSIS — E559 Vitamin D deficiency, unspecified: Secondary | ICD-10-CM | POA: Diagnosis not present

## 2015-10-01 DIAGNOSIS — R8299 Other abnormal findings in urine: Secondary | ICD-10-CM | POA: Diagnosis not present

## 2015-10-01 DIAGNOSIS — Z0000 Encounter for general adult medical examination without abnormal findings: Secondary | ICD-10-CM | POA: Diagnosis not present

## 2015-10-01 DIAGNOSIS — E559 Vitamin D deficiency, unspecified: Secondary | ICD-10-CM | POA: Diagnosis not present

## 2015-12-11 DIAGNOSIS — K644 Residual hemorrhoidal skin tags: Secondary | ICD-10-CM | POA: Diagnosis not present

## 2016-02-06 DIAGNOSIS — H2513 Age-related nuclear cataract, bilateral: Secondary | ICD-10-CM | POA: Diagnosis not present

## 2016-02-06 DIAGNOSIS — D3132 Benign neoplasm of left choroid: Secondary | ICD-10-CM | POA: Diagnosis not present

## 2016-03-10 DIAGNOSIS — L812 Freckles: Secondary | ICD-10-CM | POA: Diagnosis not present

## 2016-03-10 DIAGNOSIS — L309 Dermatitis, unspecified: Secondary | ICD-10-CM | POA: Diagnosis not present

## 2016-03-10 DIAGNOSIS — L858 Other specified epidermal thickening: Secondary | ICD-10-CM | POA: Diagnosis not present

## 2016-03-10 DIAGNOSIS — L570 Actinic keratosis: Secondary | ICD-10-CM | POA: Diagnosis not present

## 2016-06-05 DIAGNOSIS — L820 Inflamed seborrheic keratosis: Secondary | ICD-10-CM | POA: Diagnosis not present

## 2016-06-05 DIAGNOSIS — L578 Other skin changes due to chronic exposure to nonionizing radiation: Secondary | ICD-10-CM | POA: Diagnosis not present

## 2016-06-05 DIAGNOSIS — L821 Other seborrheic keratosis: Secondary | ICD-10-CM | POA: Diagnosis not present

## 2016-06-05 DIAGNOSIS — L570 Actinic keratosis: Secondary | ICD-10-CM | POA: Diagnosis not present

## 2016-06-13 DIAGNOSIS — I10 Essential (primary) hypertension: Secondary | ICD-10-CM | POA: Diagnosis not present

## 2016-06-13 DIAGNOSIS — J069 Acute upper respiratory infection, unspecified: Secondary | ICD-10-CM | POA: Diagnosis not present

## 2016-06-17 DIAGNOSIS — J209 Acute bronchitis, unspecified: Secondary | ICD-10-CM | POA: Diagnosis not present

## 2016-08-06 DIAGNOSIS — J0100 Acute maxillary sinusitis, unspecified: Secondary | ICD-10-CM | POA: Diagnosis not present

## 2016-08-27 DIAGNOSIS — Z1231 Encounter for screening mammogram for malignant neoplasm of breast: Secondary | ICD-10-CM | POA: Diagnosis not present

## 2016-08-27 DIAGNOSIS — M8589 Other specified disorders of bone density and structure, multiple sites: Secondary | ICD-10-CM | POA: Diagnosis not present

## 2016-10-27 ENCOUNTER — Other Ambulatory Visit: Payer: Self-pay | Admitting: *Deleted

## 2016-10-27 DIAGNOSIS — M7542 Impingement syndrome of left shoulder: Secondary | ICD-10-CM

## 2016-10-27 MED ORDER — NITROGLYCERIN 0.2 MG/HR TD PT24: MEDICATED_PATCH | TRANSDERMAL | 0 refills | Status: DC

## 2016-11-17 ENCOUNTER — Encounter: Payer: Self-pay | Admitting: Sports Medicine

## 2016-11-17 ENCOUNTER — Ambulatory Visit: Payer: Self-pay

## 2016-11-17 ENCOUNTER — Ambulatory Visit (INDEPENDENT_AMBULATORY_CARE_PROVIDER_SITE_OTHER): Payer: PPO | Admitting: Sports Medicine

## 2016-11-17 VITALS — BP 120/80 | Ht 68.0 in | Wt 152.0 lb

## 2016-11-17 DIAGNOSIS — S46812A Strain of other muscles, fascia and tendons at shoulder and upper arm level, left arm, initial encounter: Secondary | ICD-10-CM | POA: Diagnosis not present

## 2016-11-17 DIAGNOSIS — M75102 Unspecified rotator cuff tear or rupture of left shoulder, not specified as traumatic: Secondary | ICD-10-CM | POA: Insufficient documentation

## 2016-11-17 DIAGNOSIS — M25512 Pain in left shoulder: Secondary | ICD-10-CM

## 2016-11-17 DIAGNOSIS — M19012 Primary osteoarthritis, left shoulder: Secondary | ICD-10-CM | POA: Insufficient documentation

## 2016-11-17 DIAGNOSIS — IMO0001 Reserved for inherently not codable concepts without codable children: Secondary | ICD-10-CM

## 2016-11-17 NOTE — Progress Notes (Signed)
   Subjective:    Patient ID: Shelley Sims, female    DOB: Aug 13, 1945, 71 y.o.   MRN: [id number]  HPI [name] is a 71 year old female who presents for left shoulder pain. She reports of chronic shoulder issues for the past several years with intermittent flares. This current flare started about 2.5 weeks ago. She denies any specific injury but reports recent overuse with playing tennis more often, some weight lifting at the gym, and playing golf. She entire shoulder hurts (anterior, lateral, posterior) but pain does not radiate. Denies any numbness or tingling to the left arm. Denies any neck pain. Reports sometimes her shoulder "pops" but does not have pain with this. She has iced the area, using nitroglycerin patches, and doing piliates which does help. Her symptoms have improved over the past 2.5 weeks but her shoulder stiff. She is wondering if it is okay for her to use nitroglycerin patches intermittently and if she is further injuring the area with her activity.     2015 RCT [place] - healed with HEP/ NTG 2016 - similar problem   Review of Systems No neck pain. Right shoulder pain without radiation of the pain No numbness or tingling of the left arm  No fevers, chills, unintentional weight loss, or night sweats    Objective:   Physical Exam Gen: NAD BP 120/80   Ht 5\' 8"  (1.727 m)   Wt 152 lb (68.9 kg)   BMI 23.11 kg/m   Neck:  Normal ROM including flexion, extension, lateral rotation and side bend without discomfort   Left Shoulder:  Normal range of motion without any pain/discomfort except for decreased internal rotation compared to the right shoulder  No tenderness to palpation of the structures  5/5 strength of abduction, flexion, extension, internal and external rotation  Normal sensation to light touch Pain with empty can test  Obrien test: more pain with forearm supinated.  Pain with Hawkins test    Ultrasound of Shoulder-Left  BT short-micro-calcifications but  no swelling BT long-normal Supraspinatus tendon- 0.5cm distal hypoechoic change consistent with distal tear at foot plate.  Note there are some calcific changes in mid tendon consistent with old tear that has healed. Subscapularis tendon-normal Infraspinatus tendon- Normal Teres Minor tendon- normal AC joint - mild increase in hypoechoic swelling but minimal arthritis  Summary and Additional findings- Distal partial tera of supraspinatus tendon which on dynamic motion does not retract.  Ultrasound and interpretation by Wolfgang Phoenix. Priest Lockridge, MD     Assessment & Plan:  Partial Tear of the Supraspinatus Tendon:  Shoulder strengthening exercises given and reviewed with patient  Follow up in 6-8 weeks.  Smiley Houseman, MD PGY 2 Family Medicine   I observed and examined the patient with the resident and agree with assessment and plan.  Note reviewed and modified by me. Stefanie Libel, MD

## 2016-11-17 NOTE — Assessment & Plan Note (Signed)
HEP to emphasize spokes of wheel Tennis motion with dumbells  NTG protocol - this has helped her in past Probably use 12 week course  Re [place] in 6 weeks before playing

## 2016-12-05 DIAGNOSIS — H5231 Anisometropia: Secondary | ICD-10-CM | POA: Diagnosis not present

## 2016-12-05 DIAGNOSIS — H2513 Age-related nuclear cataract, bilateral: Secondary | ICD-10-CM | POA: Diagnosis not present

## 2016-12-05 DIAGNOSIS — D3132 Benign neoplasm of left choroid: Secondary | ICD-10-CM | POA: Diagnosis not present

## 2016-12-29 DIAGNOSIS — D229 Melanocytic nevi, unspecified: Secondary | ICD-10-CM | POA: Diagnosis not present

## 2016-12-29 DIAGNOSIS — R234 Changes in skin texture: Secondary | ICD-10-CM | POA: Diagnosis not present

## 2016-12-29 DIAGNOSIS — L812 Freckles: Secondary | ICD-10-CM | POA: Diagnosis not present

## 2016-12-29 DIAGNOSIS — L821 Other seborrheic keratosis: Secondary | ICD-10-CM | POA: Diagnosis not present

## 2016-12-29 DIAGNOSIS — L578 Other skin changes due to chronic exposure to nonionizing radiation: Secondary | ICD-10-CM | POA: Diagnosis not present

## 2016-12-29 DIAGNOSIS — Z1283 Encounter for screening for malignant neoplasm of skin: Secondary | ICD-10-CM | POA: Diagnosis not present

## 2016-12-29 DIAGNOSIS — D485 Neoplasm of uncertain behavior of skin: Secondary | ICD-10-CM | POA: Diagnosis not present

## 2016-12-29 DIAGNOSIS — D692 Other nonthrombocytopenic purpura: Secondary | ICD-10-CM | POA: Diagnosis not present

## 2016-12-29 DIAGNOSIS — D1800 Hemangioma unspecified site: Secondary | ICD-10-CM | POA: Diagnosis not present

## 2016-12-29 DIAGNOSIS — M2550 Pain in unspecified joint: Secondary | ICD-10-CM | POA: Diagnosis not present

## 2016-12-30 ENCOUNTER — Ambulatory Visit (INDEPENDENT_AMBULATORY_CARE_PROVIDER_SITE_OTHER): Payer: PPO | Admitting: Sports Medicine

## 2016-12-30 DIAGNOSIS — S46812A Strain of other muscles, fascia and tendons at shoulder and upper arm level, left arm, initial encounter: Secondary | ICD-10-CM | POA: Diagnosis not present

## 2016-12-30 DIAGNOSIS — IMO0001 Reserved for inherently not codable concepts without codable children: Secondary | ICD-10-CM

## 2016-12-30 NOTE — Assessment & Plan Note (Signed)
Ultrasound today shows healing supraspinatus tear.  Improvement in pain noted.  -Continue nitroglycerin patches and shoulder strengthening exercises with 3 lb dumbells.  -May resume light tennis -Follow up in 2 months and repeat U/S at that time

## 2016-12-30 NOTE — Progress Notes (Signed)
CC: follow up L shoulder pain   HPI:  Patient presents for 6 week follow up for left shoulder pain.  Reports chronic shoulder pain over several years which bothers her intermittently.  She was seen on 6/25 and ultrasound at that time revealed a 0.5 cm supraspinatus tear.  She has since then been doing shoulder strengthening exercises with 2 lb dumbells as well as pilates which helps.  She notes activity is not painful however she reports a painless popping of the shoulder.  Denies any numbness or tingling. She does not note swelling of the joint.   Her ROM is not limited.  She has been using Nitroglycerin patches as instructed which have helped with her pain.  She has not been icing it often or taking anti-inflammatories.    ROS: No swelling.  No numbness/tingling.   Physical exam:  71 yo female, NAD BP 120/80   Ht 5\' 8"  (1.727 m)   Wt 150 lb (68 kg)   BMI 22.81 kg/m   Left Shoulder: Inspection reveals no abnormalities, atrophy or asymmetry.  Palpation is normal with no tenderness over AC joint or bicipital groove.  ROM is full in all planes. 5/5 strength with abduction, flexion, extension, internal and external rotation  Rotator cuff strength normal throughout.  No signs of impingement with negative Neer and Hawkin's tests, negative empty can test. Speeds and Yergason's tests normal.   This is improved exam as last visit the impingement tests were +  Ultrasound of left Shoulder/ limited  Supraspinatus - hypoechoic care at foot plate is improved in appearance; less retraction of fibers.  Very distal attachment still appears to have small separation.  Ultrasound and interpretation by Wolfgang Phoenix. Walaa Carel, MD    A/P: Ultrasound today shows healing supraspinatus tear.  Improvement in pain noted.  -Continue nitroglycerin patches and shoulder strengthening exercises with 3 lb dumbells.  -May resume light tennis -Follow up in 2 months and repeat U/S at that time   I observed and examined  the patient with the resident and agree with assessment and plan.  Note reviewed and modified by me. Stefanie Libel, MD

## 2017-01-01 DIAGNOSIS — E559 Vitamin D deficiency, unspecified: Secondary | ICD-10-CM | POA: Diagnosis not present

## 2017-01-01 DIAGNOSIS — I10 Essential (primary) hypertension: Secondary | ICD-10-CM | POA: Diagnosis not present

## 2017-01-05 DIAGNOSIS — I10 Essential (primary) hypertension: Secondary | ICD-10-CM | POA: Diagnosis not present

## 2017-01-05 DIAGNOSIS — M79642 Pain in left hand: Secondary | ICD-10-CM | POA: Diagnosis not present

## 2017-01-05 DIAGNOSIS — M79641 Pain in right hand: Secondary | ICD-10-CM | POA: Diagnosis not present

## 2017-01-05 DIAGNOSIS — Z0000 Encounter for general adult medical examination without abnormal findings: Secondary | ICD-10-CM | POA: Diagnosis not present

## 2017-01-05 DIAGNOSIS — M8580 Other specified disorders of bone density and structure, unspecified site: Secondary | ICD-10-CM | POA: Diagnosis not present

## 2017-01-06 DIAGNOSIS — R3989 Other symptoms and signs involving the genitourinary system: Secondary | ICD-10-CM | POA: Diagnosis not present

## 2017-01-06 DIAGNOSIS — M79641 Pain in right hand: Secondary | ICD-10-CM | POA: Diagnosis not present

## 2017-03-05 ENCOUNTER — Other Ambulatory Visit: Payer: PPO | Admitting: Sports Medicine

## 2017-04-02 ENCOUNTER — Other Ambulatory Visit: Payer: PPO | Admitting: Sports Medicine

## 2017-04-13 DIAGNOSIS — E785 Hyperlipidemia, unspecified: Secondary | ICD-10-CM | POA: Diagnosis not present

## 2017-04-21 DIAGNOSIS — E785 Hyperlipidemia, unspecified: Secondary | ICD-10-CM | POA: Diagnosis not present

## 2017-06-18 DIAGNOSIS — H2513 Age-related nuclear cataract, bilateral: Secondary | ICD-10-CM | POA: Diagnosis not present

## 2017-07-01 DIAGNOSIS — L853 Xerosis cutis: Secondary | ICD-10-CM | POA: Diagnosis not present

## 2017-07-01 DIAGNOSIS — L820 Inflamed seborrheic keratosis: Secondary | ICD-10-CM | POA: Diagnosis not present

## 2017-07-01 DIAGNOSIS — H02885 Meibomian gland dysfunction left lower eyelid: Secondary | ICD-10-CM | POA: Diagnosis not present

## 2017-07-03 DIAGNOSIS — H02826 Cysts of left eye, unspecified eyelid: Secondary | ICD-10-CM | POA: Diagnosis not present

## 2017-09-02 DIAGNOSIS — Z1231 Encounter for screening mammogram for malignant neoplasm of breast: Secondary | ICD-10-CM | POA: Diagnosis not present

## 2017-09-03 DIAGNOSIS — D485 Neoplasm of uncertain behavior of skin: Secondary | ICD-10-CM | POA: Diagnosis not present

## 2017-09-03 DIAGNOSIS — D225 Melanocytic nevi of trunk: Secondary | ICD-10-CM | POA: Diagnosis not present

## 2017-09-03 DIAGNOSIS — L821 Other seborrheic keratosis: Secondary | ICD-10-CM | POA: Diagnosis not present

## 2017-09-03 DIAGNOSIS — L812 Freckles: Secondary | ICD-10-CM | POA: Diagnosis not present

## 2017-09-03 DIAGNOSIS — D229 Melanocytic nevi, unspecified: Secondary | ICD-10-CM

## 2017-09-03 DIAGNOSIS — D692 Other nonthrombocytopenic purpura: Secondary | ICD-10-CM | POA: Diagnosis not present

## 2017-09-03 DIAGNOSIS — L570 Actinic keratosis: Secondary | ICD-10-CM | POA: Diagnosis not present

## 2017-09-03 DIAGNOSIS — D2230 Melanocytic nevi of unspecified part of face: Secondary | ICD-10-CM | POA: Diagnosis not present

## 2017-09-03 DIAGNOSIS — L578 Other skin changes due to chronic exposure to nonionizing radiation: Secondary | ICD-10-CM | POA: Diagnosis not present

## 2017-09-03 HISTORY — DX: Melanocytic nevi, unspecified: D22.9

## 2018-01-27 DIAGNOSIS — Z0000 Encounter for general adult medical examination without abnormal findings: Secondary | ICD-10-CM | POA: Diagnosis not present

## 2018-01-27 DIAGNOSIS — I739 Peripheral vascular disease, unspecified: Secondary | ICD-10-CM | POA: Diagnosis not present

## 2018-01-27 DIAGNOSIS — I10 Essential (primary) hypertension: Secondary | ICD-10-CM | POA: Diagnosis not present

## 2018-01-28 ENCOUNTER — Other Ambulatory Visit: Payer: Self-pay | Admitting: Family Medicine

## 2018-01-28 DIAGNOSIS — I739 Peripheral vascular disease, unspecified: Secondary | ICD-10-CM

## 2018-02-03 ENCOUNTER — Ambulatory Visit
Admission: RE | Admit: 2018-02-03 | Discharge: 2018-02-03 | Disposition: A | Discharge status: Home / Self Care | Payer: PPO | Source: Ambulatory Visit | Attending: Family Medicine | Admitting: Family Medicine

## 2018-02-03 DIAGNOSIS — I6523 Occlusion and stenosis of bilateral carotid arteries: Secondary | ICD-10-CM | POA: Insufficient documentation

## 2018-02-03 DIAGNOSIS — I739 Peripheral vascular disease, unspecified: Secondary | ICD-10-CM | POA: Insufficient documentation

## 2018-02-03 DIAGNOSIS — E041 Nontoxic single thyroid nodule: Secondary | ICD-10-CM | POA: Diagnosis not present

## 2018-02-03 IMAGING — US US CAROTID DUPLEX BILAT
1 series · 13 of 24 positions shown · non-contrast
Comparison: None.

CLINICAL DATA: Peripheral vascular disease

EXAM:
BILATERAL CAROTID DUPLEX ULTRASOUND
TECHNIQUE: Gray scale imaging, color Doppler and duplex ultrasound were
performed of bilateral carotid and vertebral arteries in the neck.

[Series 1: us carotid duplex bilat · 13 of 69 slices shown]
[im 1/69]
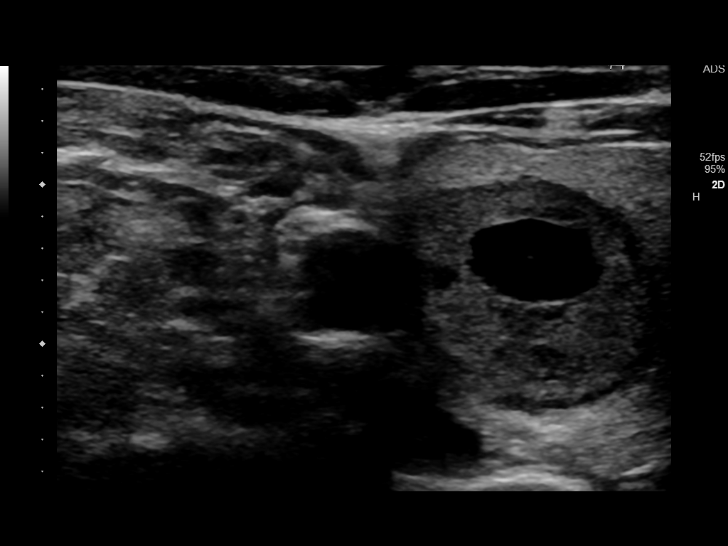
[im 6/69]
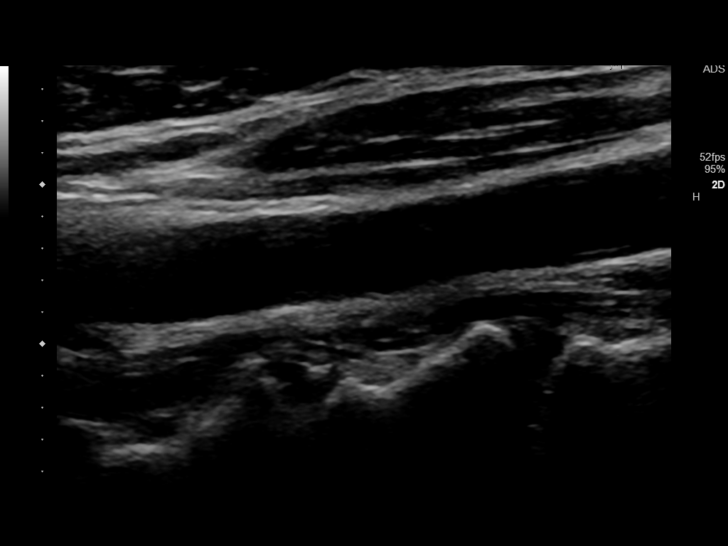
[im 12/69]
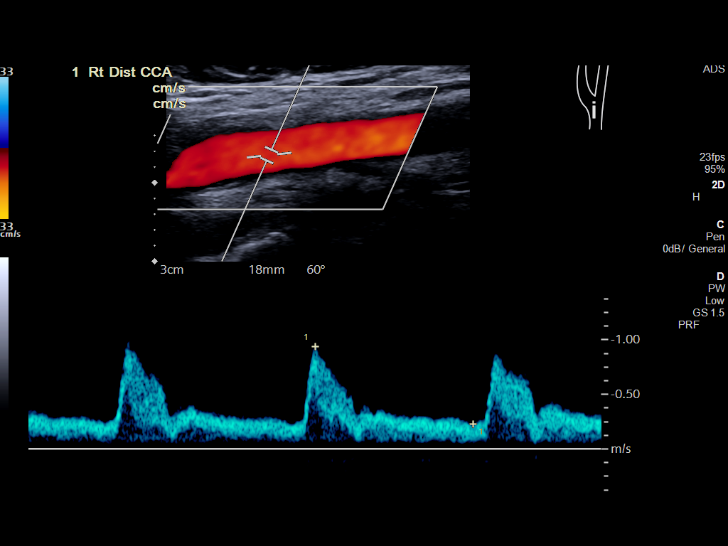
[im 18/69]
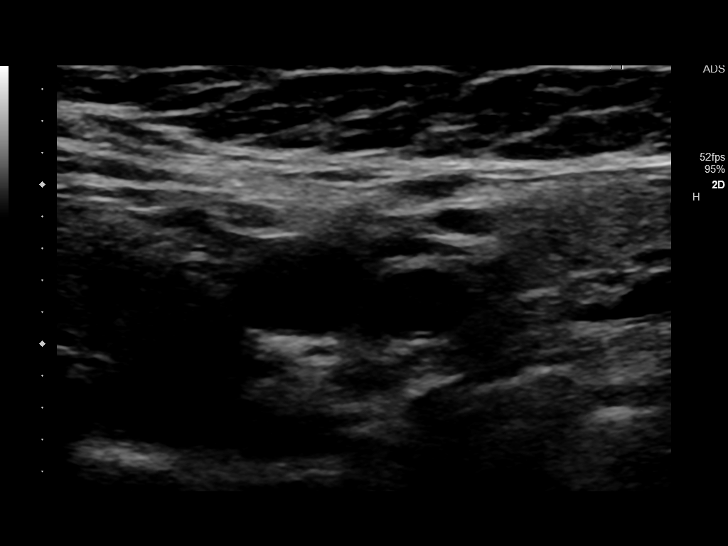
[im 24/69]
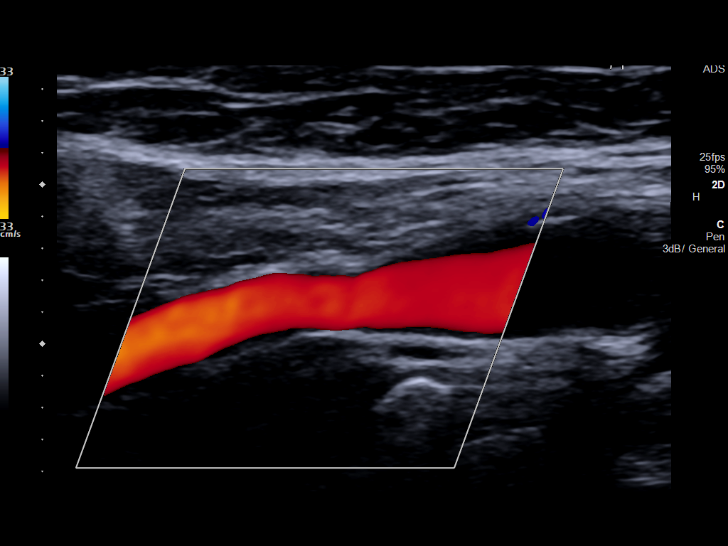
[im 30/69]
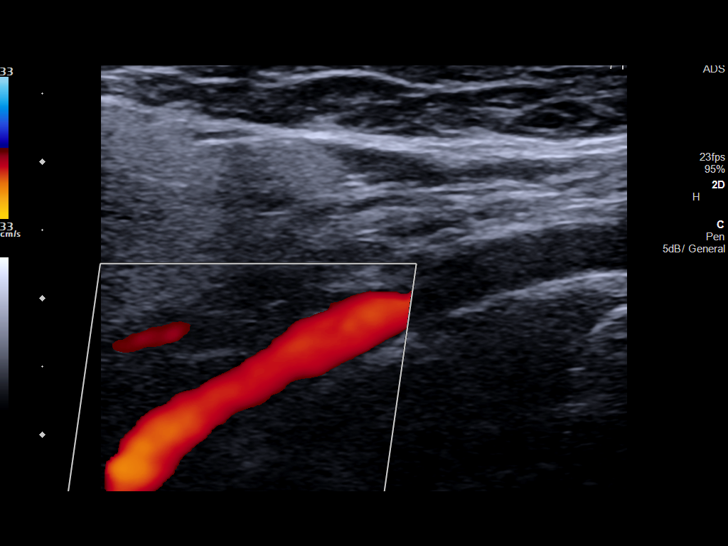
[im 36/69]
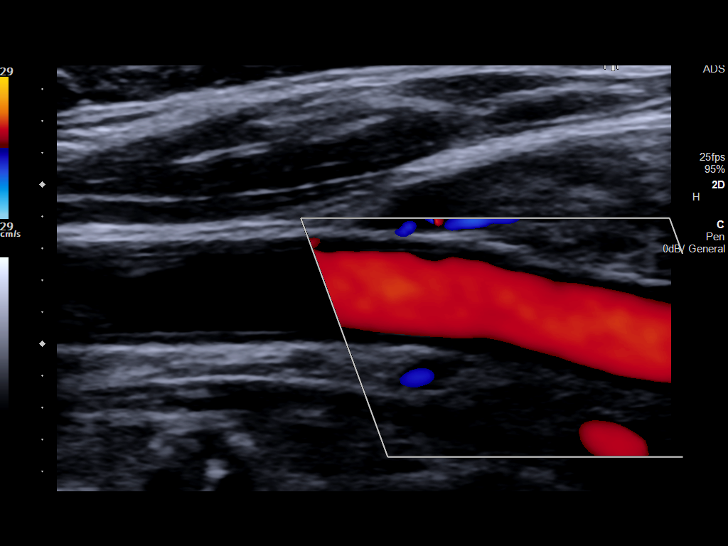
[im 39/69]
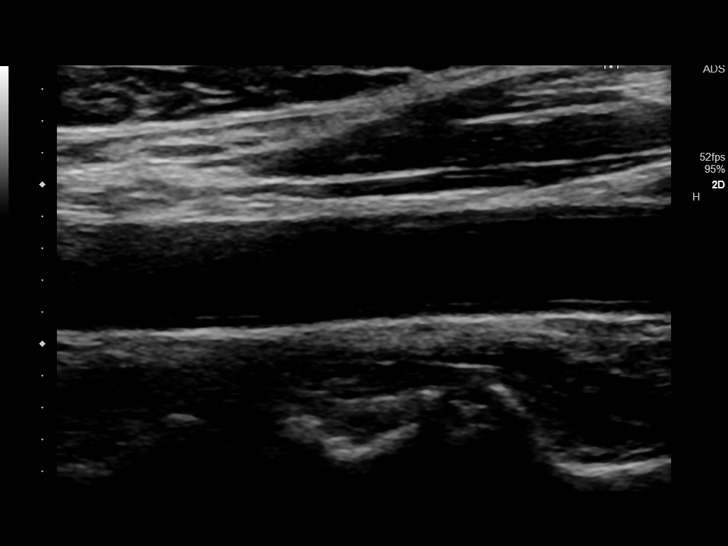
[im 45/69]
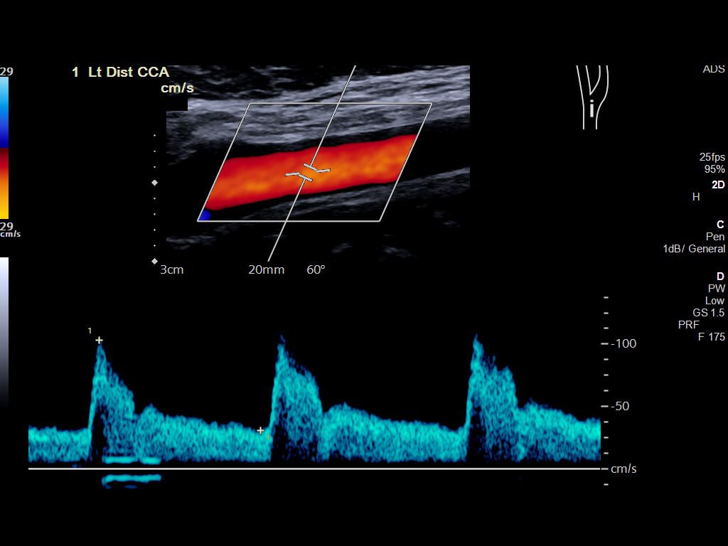
[im 51/69]
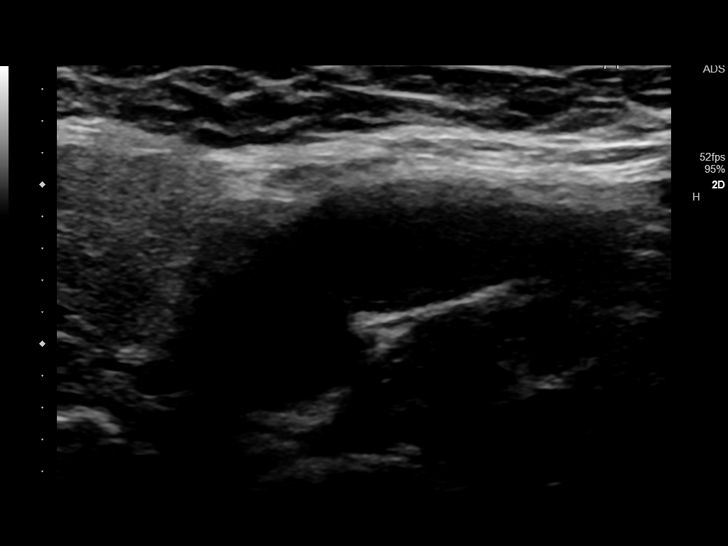
[im 57/69]
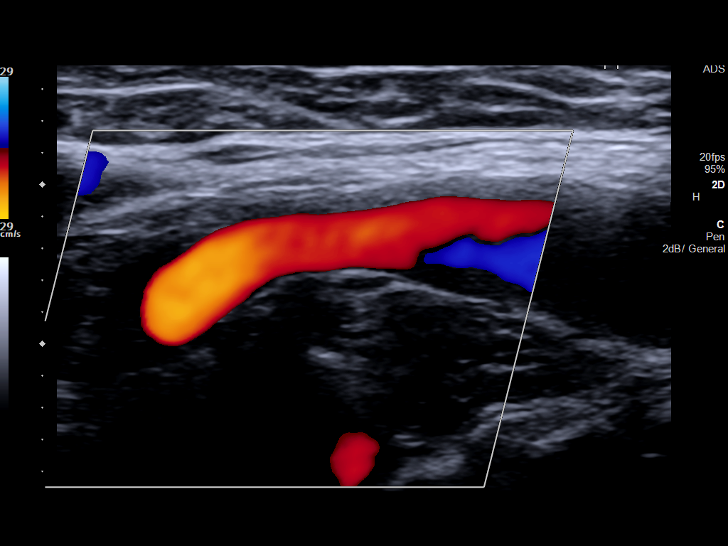
[im 63/69]
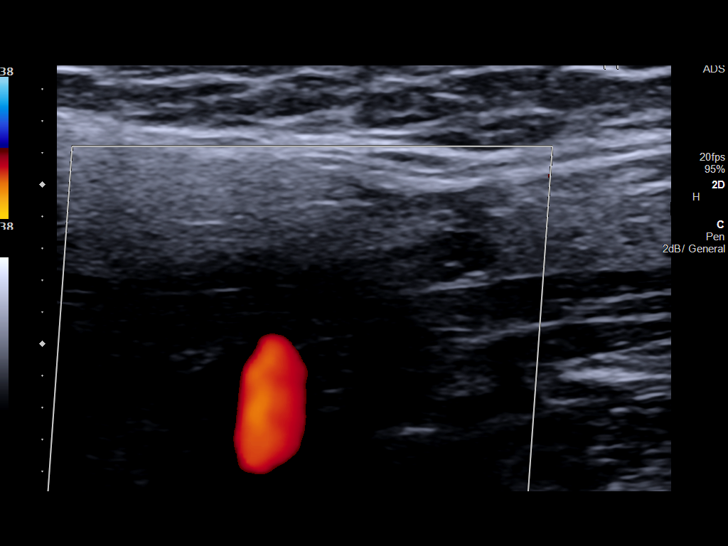
[im 69/69]
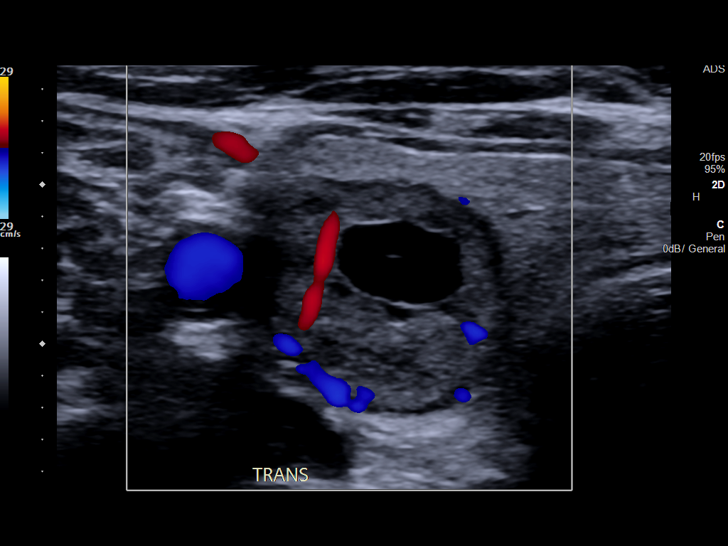

[13 of 24 positions shown; findings below may reference images not displayed]

FINDINGS: Criteria: Quantification of carotid stenosis is based on velocity
parameters that correlate the residual internal carotid diameter
with NASCET-based stenosis levels, using the diameter of the distal
internal carotid lumen as the denominator for stenosis measurement.

The following velocity measurements were obtained:

RIGHT

ICA: 116 cm/sec

CCA: 116 cm/sec

SYSTOLIC ICA/CCA RATIO:

ECA: 111 cm/sec

LEFT

ICA: 121 cm/sec

CCA: 103 cm/sec

SYSTOLIC ICA/CCA RATIO:

ECA: 91 cm/sec

RIGHT CAROTID ARTERY: Mild calcified plaque in the bulb. Low
resistance internal carotid Doppler pattern.

RIGHT VERTEBRAL ARTERY:  Antegrade.

LEFT CAROTID ARTERY: Mild calcified plaque in the bulb. Low
resistance internal carotid Doppler pattern is preserved.

LEFT VERTEBRAL ARTERY:  Antegrade.

Additional findings: A solid and cystic hypoechoic nodule in the
right lobe of the thyroid gland measures up to 2.3 cm.
IMPRESSION: Less than 50% stenosis in the right and left internal carotid
arteries.

2.3 cm right thyroid nodule. TR3 lesion based on TI rads criteria.
*Given size (>/= 1.5 - 2.4 cm) and appearance, a follow-up
ultrasound in 1 year should be considered based on TI-RADS criteria.
Please note, a complete thyroid ultrasound was not performed.

## 2018-02-04 DIAGNOSIS — E559 Vitamin D deficiency, unspecified: Secondary | ICD-10-CM | POA: Diagnosis not present

## 2018-02-04 DIAGNOSIS — I10 Essential (primary) hypertension: Secondary | ICD-10-CM | POA: Diagnosis not present

## 2018-02-15 DIAGNOSIS — R350 Frequency of micturition: Secondary | ICD-10-CM | POA: Diagnosis not present

## 2018-03-05 DIAGNOSIS — H00024 Hordeolum internum left upper eyelid: Secondary | ICD-10-CM | POA: Diagnosis not present

## 2018-05-30 ENCOUNTER — Encounter: Payer: Self-pay | Admitting: Gastroenterology

## 2018-05-31 DIAGNOSIS — H2513 Age-related nuclear cataract, bilateral: Secondary | ICD-10-CM | POA: Diagnosis not present

## 2018-06-07 DIAGNOSIS — Z1283 Encounter for screening for malignant neoplasm of skin: Secondary | ICD-10-CM | POA: Diagnosis not present

## 2018-06-07 DIAGNOSIS — L400 Psoriasis vulgaris: Secondary | ICD-10-CM | POA: Diagnosis not present

## 2018-06-07 DIAGNOSIS — D1800 Hemangioma unspecified site: Secondary | ICD-10-CM | POA: Diagnosis not present

## 2018-06-07 DIAGNOSIS — D485 Neoplasm of uncertain behavior of skin: Secondary | ICD-10-CM | POA: Diagnosis not present

## 2018-06-07 DIAGNOSIS — D2270 Melanocytic nevi of unspecified lower limb, including hip: Secondary | ICD-10-CM | POA: Diagnosis not present

## 2018-06-07 DIAGNOSIS — D2230 Melanocytic nevi of unspecified part of face: Secondary | ICD-10-CM | POA: Diagnosis not present

## 2018-06-07 DIAGNOSIS — D2260 Melanocytic nevi of unspecified upper limb, including shoulder: Secondary | ICD-10-CM | POA: Diagnosis not present

## 2018-06-07 DIAGNOSIS — L812 Freckles: Secondary | ICD-10-CM | POA: Diagnosis not present

## 2018-06-07 DIAGNOSIS — D225 Melanocytic nevi of trunk: Secondary | ICD-10-CM | POA: Diagnosis not present

## 2018-06-07 DIAGNOSIS — L821 Other seborrheic keratosis: Secondary | ICD-10-CM | POA: Diagnosis not present

## 2018-06-07 DIAGNOSIS — L820 Inflamed seborrheic keratosis: Secondary | ICD-10-CM | POA: Diagnosis not present

## 2018-06-07 DIAGNOSIS — L578 Other skin changes due to chronic exposure to nonionizing radiation: Secondary | ICD-10-CM | POA: Diagnosis not present

## 2018-06-22 DIAGNOSIS — Z1211 Encounter for screening for malignant neoplasm of colon: Secondary | ICD-10-CM | POA: Diagnosis not present

## 2018-07-22 DIAGNOSIS — L570 Actinic keratosis: Secondary | ICD-10-CM | POA: Diagnosis not present

## 2018-07-22 DIAGNOSIS — H00012 Hordeolum externum right lower eyelid: Secondary | ICD-10-CM | POA: Diagnosis not present

## 2018-07-22 DIAGNOSIS — D485 Neoplasm of uncertain behavior of skin: Secondary | ICD-10-CM | POA: Diagnosis not present

## 2018-08-23 ENCOUNTER — Ambulatory Visit: Admit: 2018-08-23 | Payer: PPO | Admitting: Unknown Physician Specialty

## 2018-08-23 SURGERY — COLONOSCOPY WITH PROPOFOL
Anesthesia: General

## 2018-10-12 ENCOUNTER — Ambulatory Visit (INDEPENDENT_AMBULATORY_CARE_PROVIDER_SITE_OTHER): Payer: PPO | Admitting: Sports Medicine

## 2018-10-12 ENCOUNTER — Other Ambulatory Visit: Payer: Self-pay

## 2018-10-12 ENCOUNTER — Ambulatory Visit: Payer: Self-pay

## 2018-10-12 ENCOUNTER — Encounter: Payer: Self-pay | Admitting: Sports Medicine

## 2018-10-12 VITALS — BP 136/70 | Ht 68.0 in | Wt 152.0 lb

## 2018-10-12 DIAGNOSIS — M25561 Pain in right knee: Secondary | ICD-10-CM

## 2018-10-12 DIAGNOSIS — M7542 Impingement syndrome of left shoulder: Secondary | ICD-10-CM | POA: Diagnosis not present

## 2018-10-12 DIAGNOSIS — S46012D Strain of muscle(s) and tendon(s) of the rotator cuff of left shoulder, subsequent encounter: Secondary | ICD-10-CM | POA: Diagnosis not present

## 2018-10-12 MED ORDER — NITROGLYCERIN 0.2 MG/HR TD PT24: MEDICATED_PATCH | TRANSDERMAL | 6 refills | Status: AC

## 2018-10-12 NOTE — Assessment & Plan Note (Signed)
This appeared a small partial tear and continues to do well with pilates and HEP  Uses periodic NTG patches for pain and gets excellent relief OK this for refill

## 2018-10-12 NOTE — Assessment & Plan Note (Signed)
Based on [place] and exam I do not believe she has any IA injury Clinically and on [place] has no significant DJD Suspect this is soft tissue tear with small loose fragment and localized swelling  Reassured SLR, LLR, lateral step ups Compression sleeve Rest x 1 week and then try to resume play

## 2018-10-12 NOTE — Progress Notes (Signed)
CC: RT knee pain  Patient trying a cross over stretch pulling her knee across her left leg when she felt a sharp pain on outside or RT knee and under the edge of patella This was 2.5 weeks ago Rested and used ice No or minimal swelling She has tried to RT tennis but says pain in cutting to RT  Generally her knees have done well Has had a rare time when it seemed to catch and almost lock  ROS No redness or warmth No giving way No locking No pain on normal walking  PE Thin older F in NAD BP 136/70   Ht 5\' 8"  (1.727 m)   Wt 152 lb (68.9 kg)   BMI 23.11 kg/m   Knee: RT Normal to inspection with no erythema or effusion or obvious bony abnormalities. Palpation normal with no warmth or joint line tenderness or patellar tenderness or condyle tenderness. ROM normal in flexion and extension and lower leg rotation. Ligaments with solid consistent endpoints including ACL, PCL, LCL, MCL. Negative Mcmurray's and provocative meniscal tests on left On RT there is clicking and mild TTP along lateral joint line She retains good flexion of 155 deg vs. 160 on left Lacks 3 deg full extension on RT Non painful patellar compression. Patellar and quadriceps tendons unremarkable. Hamstring and quadriceps strength is normal. Abductor strength is good  Ultrasound of Right knee  Suprapatellar pouch is normal with no effusion Quadriceps and patellar tendons normal Medial meniscus intact and joint line preserved with no spurring Lateral meniscus - there is a small spur at joint line and there is hypoechoic swelling with small fragment of cartilage density just along joint line but remainder of meniscus looks intact No narrowing of lateral joint line and only 1 spur noted  Impression: soft tissue injury to lateral knee capsule with mild hypoechoic swelling  Ultrasound and interpretation by Wolfgang Phoenix. Oneida Alar, MD

## 2018-12-14 DIAGNOSIS — Z1231 Encounter for screening mammogram for malignant neoplasm of breast: Secondary | ICD-10-CM | POA: Diagnosis not present

## 2018-12-14 DIAGNOSIS — Z8262 Family history of osteoporosis: Secondary | ICD-10-CM | POA: Diagnosis not present

## 2018-12-14 DIAGNOSIS — M8589 Other specified disorders of bone density and structure, multiple sites: Secondary | ICD-10-CM | POA: Diagnosis not present

## 2019-01-06 ENCOUNTER — Other Ambulatory Visit: Payer: Self-pay | Admitting: Family Medicine

## 2019-01-06 DIAGNOSIS — K219 Gastro-esophageal reflux disease without esophagitis: Secondary | ICD-10-CM | POA: Diagnosis not present

## 2019-01-06 DIAGNOSIS — J301 Allergic rhinitis due to pollen: Secondary | ICD-10-CM | POA: Diagnosis not present

## 2019-01-06 DIAGNOSIS — R9389 Abnormal findings on diagnostic imaging of other specified body structures: Secondary | ICD-10-CM

## 2019-01-06 DIAGNOSIS — E041 Nontoxic single thyroid nodule: Secondary | ICD-10-CM | POA: Diagnosis not present

## 2019-01-11 ENCOUNTER — Other Ambulatory Visit: Payer: Self-pay

## 2019-01-11 ENCOUNTER — Ambulatory Visit
Admission: RE | Admit: 2019-01-11 | Discharge: 2019-01-11 | Disposition: A | Discharge status: Home / Self Care | Payer: PPO | Source: Ambulatory Visit | Attending: Family Medicine | Admitting: Family Medicine

## 2019-01-11 DIAGNOSIS — R9389 Abnormal findings on diagnostic imaging of other specified body structures: Secondary | ICD-10-CM

## 2019-01-11 DIAGNOSIS — E041 Nontoxic single thyroid nodule: Secondary | ICD-10-CM | POA: Diagnosis not present

## 2019-02-10 DIAGNOSIS — K219 Gastro-esophageal reflux disease without esophagitis: Secondary | ICD-10-CM | POA: Diagnosis not present

## 2019-02-10 DIAGNOSIS — E041 Nontoxic single thyroid nodule: Secondary | ICD-10-CM | POA: Diagnosis not present

## 2019-02-14 DIAGNOSIS — Z0000 Encounter for general adult medical examination without abnormal findings: Secondary | ICD-10-CM | POA: Diagnosis not present

## 2019-02-14 DIAGNOSIS — E041 Nontoxic single thyroid nodule: Secondary | ICD-10-CM | POA: Diagnosis not present

## 2019-02-21 DIAGNOSIS — I10 Essential (primary) hypertension: Secondary | ICD-10-CM | POA: Diagnosis not present

## 2019-02-21 DIAGNOSIS — M79642 Pain in left hand: Secondary | ICD-10-CM | POA: Diagnosis not present

## 2019-02-21 DIAGNOSIS — Z636 Dependent relative needing care at home: Secondary | ICD-10-CM | POA: Diagnosis not present

## 2019-02-21 DIAGNOSIS — M65312 Trigger thumb, left thumb: Secondary | ICD-10-CM | POA: Diagnosis not present

## 2019-02-21 DIAGNOSIS — K219 Gastro-esophageal reflux disease without esophagitis: Secondary | ICD-10-CM | POA: Diagnosis not present

## 2019-03-07 ENCOUNTER — Other Ambulatory Visit: Payer: Self-pay

## 2019-03-07 DIAGNOSIS — Z780 Asymptomatic menopausal state: Secondary | ICD-10-CM | POA: Insufficient documentation

## 2019-03-07 DIAGNOSIS — M8580 Other specified disorders of bone density and structure, unspecified site: Secondary | ICD-10-CM | POA: Insufficient documentation

## 2019-03-07 DIAGNOSIS — I10 Essential (primary) hypertension: Secondary | ICD-10-CM | POA: Insufficient documentation

## 2019-03-08 ENCOUNTER — Other Ambulatory Visit: Payer: Self-pay

## 2019-03-08 ENCOUNTER — Ambulatory Visit (INDEPENDENT_AMBULATORY_CARE_PROVIDER_SITE_OTHER): Payer: PPO | Admitting: Gastroenterology

## 2019-03-08 ENCOUNTER — Encounter: Payer: Self-pay | Admitting: Gastroenterology

## 2019-03-08 VITALS — BP 125/77 | HR 67 | Temp 98.3°F | Wt 155.8 lb

## 2019-03-08 DIAGNOSIS — Z1211 Encounter for screening for malignant neoplasm of colon: Secondary | ICD-10-CM

## 2019-03-08 DIAGNOSIS — K219 Gastro-esophageal reflux disease without esophagitis: Secondary | ICD-10-CM | POA: Diagnosis not present

## 2019-03-08 NOTE — Progress Notes (Signed)
Gastroenterology Consultation  Referring Provider:     Carloyn Manner, MD Primary Care Physician:  Mary[place] Pink, MD Primary Gastroenterologist:  Dr. Allen Norris     Reason for Consultation:     Sore throat and in need of a screening colonoscopy        HPI:   Shelley Sims is a 73 y.o. y/o female referred for consultation & management of sore throat and in need of a screening colonoscopy by Dr. Maryland Pink, MD.  This patient comes in today after being seen by Dr. Percell Boston PA back in January.  At that time the patient was set up for screening colonoscopy since she had not had a screening colonoscopy since October 2009.  In March the patient canceled the procedure due to the pandemic.  The patient was to be rescheduled after the pandemic had subsided.  She has recently seen her ENT doctor for a sore throat.  The patient had a laryngoscope examination that showed her to have signs consistent with LPR.  The patient was started on omeprazole 20 mg a day and recommended to follow-up with me. The patient now reports that her sore throat is better although she does still have some intermittent burning in her throat.  History reviewed. No pertinent past medical history.  History reviewed. No pertinent surgical history.  Prior to Admission medications   Medication Sig Start Date End Date Taking? Authorizing Provider  ALPRAZolam (XANAX) 0.5 MG tablet TAKE ONE-HALF TO ONE TABLET EVERY EIGHT HOURS AS NEEDED 07/13/14   [provider]  azelastine (ASTELIN) 0.1 % nasal spray  01/06/19   [provider]  Calcium-Vitamin D 600-200 MG-UNIT per tablet Take by mouth.    [provider]  chlorhexidine (PERIDEX) 0.12 % solution  07/19/15   [provider]  Cholecalciferol (VITAMIN D) 400 UNITS capsule Take by mouth.    [provider]  clindamycin (CLEOCIN) 300 MG capsule  07/19/15   [provider]  cyclobenzaprine (FLEXERIL) 5 MG tablet Take 1 tablet (5 mg  total) by mouth 3 (three) times daily as needed for muscle spasms. 01/25/14   Pick-Jacobs, John C, DO  losartan-hydrochlorothiazide (HYZAAR) 100-12.5 MG per tablet TAKE 1 TABLET EVERY DAY 08/22/14   [provider]  mometasone (ELOCON) 0.1 % cream  01/03/15   [provider]  nitroGLYCERIN (NITRODUR - DOSED IN MG/24 HR) 0.2 mg/hr patch USE 1/4 PATCH FOR 24 HOURS TO THE AFFECTED AREA 10/12/18   Stefanie Libel, MD  olmesartan [employer]) 40 MG tablet  12/01/18   [provider]  olmesartan-hydrochlorothiazide (BENICAR HCT) 40-12.5 MG per tablet Take by mouth. 01/17/14   [provider]  omeprazole (PRILOSEC) 20 MG capsule  01/06/19   [provider]  predniSONE (DELTASONE) 20 MG tablet  01/03/15   [provider]    History reviewed. No pertinent family history.   Social History   Tobacco Use  . Smoking status: Never Smoker  . Smokeless tobacco: Never Used  Substance Use Topics  . Alcohol use: Not on file  . Drug use: Not on file    Allergies as of 03/08/2019 - Review Complete 03/08/2019  Allergen Reaction Noted  . Penicillins  03/09/2008    Review of Systems:    All systems reviewed and negative except where noted in HPI.   Physical Exam:  BP 125/77 (BP Location: Right Arm, Patient Position: Sitting)   Pulse 67   Temp 98.3 F (36.8 C) (Oral)   Wt 155  lb 12.8 oz (70.7 kg)   BMI 23.69 kg/m  No LMP recorded. Patient is postmenopausal. General:   Alert,  Well-developed, well-nourished, pleasant and cooperative in NAD Head:  Normocephalic and atraumatic. Eyes:  Sclera clear, no icterus.   Conjunctiva pink. Ears:  Normal auditory acuity. Neck:  Supple; no masses or thyromegaly. Lungs:  Respirations even and unlabored.  Clear throughout to auscultation.   No wheezes, crackles, or rhonchi. No acute distress. Heart:  Regular rate and rhythm; no murmurs, clicks, rubs, or gallops. Abdomen:  Normal bowel sounds.  No bruits.  Soft, non-tender  and non-distended without masses, hepatosplenomegaly or hernias noted.  No guarding or rebound tenderness.  Negative Carnett sign.   Rectal:  Deferred.  Msk:  Symmetrical without gross deformities.  Good, equal movement & strength bilaterally. Pulses:  Normal pulses noted. Extremities:  No clubbing or edema.  No cyanosis. Neurologic:  Alert and oriented x3;  grossly normal neurologically. Skin:  Intact without significant lesions or rashes.  No jaundice. Lymph Nodes:  No significant cervical adenopathy. Psych:  Alert and cooperative. Normal mood and affect.  Imaging Studies: No results found.  Assessment and Plan:   Shelley Sims is a 73 y.o. y/o female who comes in today with a report of needing a screening colonoscopy and burning in her throat.  The patient states that she felt slightly better on her omeprazole. The patient has been given samples of Dexilant to try and see if this makes a big difference in her symptoms.  She will also be set up for an EGD and colonoscopy to look for signs of Barrett's or esophagitis in addition to her need for screening colonoscopy.  The patient has been explained the plan and agrees with it.  This visit consisted of 35 minutes face to face contact with myself and at least 60% of this time was spent in counseling and education regarding diagnosis, treatment options, medication management, risks and benefits of treatment.  [name], MD. Marval Regal    Note: This dictation was prepared with Dragon dictation along with smaller phrase technology. Any transcriptional errors that result from this process are unintentional.

## 2019-03-15 ENCOUNTER — Encounter: Payer: Self-pay | Admitting: Sports Medicine

## 2019-03-15 ENCOUNTER — Ambulatory Visit: Payer: PPO | Admitting: Sports Medicine

## 2019-03-15 ENCOUNTER — Other Ambulatory Visit: Payer: Self-pay

## 2019-03-15 DIAGNOSIS — M65312 Trigger thumb, left thumb: Secondary | ICD-10-CM | POA: Diagnosis not present

## 2019-03-15 MED ORDER — TRIAMCINOLONE ACETONIDE 10 MG/ML IJ SUSP
10.0000 mg | Freq: Once | INTRAMUSCULAR | Status: AC
  Administered 2019-03-15: 5 mg via INTRA_ARTICULAR

## 2019-03-15 NOTE — Assessment & Plan Note (Signed)
I advised her to give this 1 month  See if the steroid makes the catching less frequently Use warm water soaks Use some local massage and motion exercises  She should rest for 5 days before resuming activities with that hand after her injection today

## 2019-03-15 NOTE — Progress Notes (Signed)
CC; Left thumb pain  For the past few months the patient's left is seen stiffer and sometimes painful More recently it starts to catch with flexion and extension Now most mornings when she wakes up and will get stuck briefly  She is left-handed She has played a lot of tennis and does some other work with her hands She does not feel that she has a specific injury but that this came on gradually  Past history She has had pain in her shoulder and her right knee as well as plantar fibromatosis All of these have responded to conservative treatment  Family history Mother had some significant arthritis in her hands  Review of systems Mild morning stiffness of hands but primarily over the thumb  No swelling of other hand joints  Physical exam Pleasant female in no acute distress BP 130/82   Ht 5' 7.5" (1.715 m)   Wt 153 lb (69.4 kg)   BMI 23.61 kg/m   Left catches with flexion and extension A nodule is palpable at the base of the first MCP The remainder of the hand does not show any significant arthritic swelling There is some mild nodular change at the PIP joints  Ultrasound of left MCP The joint does not show significant arthritis or effusion There is some thickening and calcification in the tendon sheath of the flexor hallucis tendon  Procedure:  Injection of Left tendon sheath of flexor hallucis Consent obtained and verified. Time-out conducted. Noted no overlying erythema, induration, or other signs of local infection. Skin prepped in a sterile fashion. Topical analgesic spray: Ethyl chloride. Completed without difficulty using US guidance Meds: 1/2 cc kenalog 10 and 1/2 cc lidocaine Pain immediately improved suggesting accurate placement of the medication. Advised to call if fevers/chills, erythema, induration, drainage, or persistent bleeding.

## 2019-03-25 ENCOUNTER — Other Ambulatory Visit
Admission: RE | Admit: 2019-03-25 | Discharge: 2019-03-25 | Disposition: A | Discharge status: Home / Self Care | Payer: PPO | Source: Ambulatory Visit | Attending: Gastroenterology | Admitting: Gastroenterology

## 2019-03-25 ENCOUNTER — Other Ambulatory Visit: Payer: Self-pay

## 2019-03-25 DIAGNOSIS — Z01812 Encounter for preprocedural laboratory examination: Secondary | ICD-10-CM | POA: Diagnosis not present

## 2019-03-25 DIAGNOSIS — Z20828 Contact with and (suspected) exposure to other viral communicable diseases: Secondary | ICD-10-CM | POA: Insufficient documentation

## 2019-03-25 LAB — SARS CORONAVIRUS 2 (TAT 6-24 HRS): SARS Coronavirus 2: NEGATIVE

## 2019-03-28 ENCOUNTER — Telehealth: Payer: Self-pay

## 2019-03-28 NOTE — Telephone Encounter (Signed)
Patients colonoscopy has been canceled with Jocelyn Lamer in Endo due to patient having a tooth abscess.  Colonoscopy was with Dr. Allen Norris.   Patient has been informed she will not receive a cancellation fee due to the fact that she has a medical reason for canceling.  Thanks Daiana Vitiello

## 2019-03-29 ENCOUNTER — Ambulatory Visit: Admission: RE | Admit: 2019-03-29 | Payer: PPO | Source: Home / Self Care | Admitting: Gastroenterology

## 2019-03-29 ENCOUNTER — Encounter: Admission: RE | Payer: Self-pay | Source: Home / Self Care

## 2019-03-29 SURGERY — COLONOSCOPY WITH PROPOFOL
Anesthesia: General

## 2019-04-06 ENCOUNTER — Telehealth: Payer: Self-pay | Admitting: Gastroenterology

## 2019-04-06 NOTE — Telephone Encounter (Signed)
Pt left vm she had some questions about possibly rescheduling a colonoscopy with Dr. Allen Norris

## 2019-04-07 ENCOUNTER — Telehealth: Payer: Self-pay

## 2019-04-07 NOTE — Telephone Encounter (Signed)
LVM returning patients call to schedule her colonoscopy.  Asked her to call me back regarding scheduling.  Thanks [employer]

## 2019-06-02 DIAGNOSIS — M65312 Trigger thumb, left thumb: Secondary | ICD-10-CM | POA: Diagnosis not present

## 2019-06-02 DIAGNOSIS — M79642 Pain in left hand: Secondary | ICD-10-CM | POA: Diagnosis not present

## 2019-06-14 ENCOUNTER — Ambulatory Visit: Payer: PPO | Attending: Internal Medicine

## 2019-06-14 DIAGNOSIS — Z23 Encounter for immunization: Secondary | ICD-10-CM | POA: Insufficient documentation

## 2019-06-14 NOTE — Progress Notes (Signed)
   Covid-19 Vaccination Clinic  Name:  Shelley Sims    MRN: [id number] DOB: July 13, 1945  06/14/2019  Shelley Sims was observed post Covid-19 immunization for 15 minutes without incidence. She was provided with Vaccine Information Sheet and instruction to access the V-Safe system.   Shelley Sims was instructed to call 911 with any severe reactions post vaccine: [name] Difficulty breathing  . Swelling of your face and throat  . A fast heartbeat  . A bad rash all over your body  . Dizziness and weakness    Immunizations Administered    Name Date Dose VIS Date Route   Pfizer COVID-19 Vaccine 06/14/2019  2:27 PM 0.3 mL 05/06/2019 Intramuscular   Manufacturer: [place]   Lot: [id number]   [place]: [id number]

## 2019-07-05 ENCOUNTER — Ambulatory Visit: Payer: PPO

## 2019-07-05 ENCOUNTER — Ambulatory Visit: Payer: PPO | Attending: Internal Medicine

## 2019-07-05 DIAGNOSIS — Z23 Encounter for immunization: Secondary | ICD-10-CM | POA: Insufficient documentation

## 2019-07-05 NOTE — Progress Notes (Signed)
   Covid-19 Vaccination Clinic  Name:  Shelley Sims    MRN: [id number] DOB: 02-May-1946  07/05/2019  [name] was observed post Covid-19 immunization for 15 minutes without incidence. She was provided with Vaccine Information Sheet and instruction to access the V-Safe system.   [name] was instructed to call 911 with any severe reactions post vaccine: [name] Difficulty breathing  . Swelling of your face and throat  . A fast heartbeat  . A bad rash all over your body  . Dizziness and weakness    Immunizations Administered    Name Date Dose VIS Date Route   Pfizer COVID-19 Vaccine 07/05/2019  8:37 AM 0.3 mL 05/06/2019 Intramuscular   Manufacturer: [place]   Lot: [id number]   [place]: [id number]

## 2019-07-21 DIAGNOSIS — H353131 Nonexudative age-related macular degeneration, bilateral, early dry stage: Secondary | ICD-10-CM | POA: Diagnosis not present

## 2019-09-08 DIAGNOSIS — M65312 Trigger thumb, left thumb: Secondary | ICD-10-CM | POA: Diagnosis not present

## 2019-09-08 DIAGNOSIS — Z4789 Encounter for other orthopedic aftercare: Secondary | ICD-10-CM | POA: Insufficient documentation

## 2019-09-21 ENCOUNTER — Other Ambulatory Visit: Payer: Self-pay

## 2019-09-21 ENCOUNTER — Ambulatory Visit: Payer: PPO | Admitting: Dermatology

## 2019-09-21 DIAGNOSIS — D229 Melanocytic nevi, unspecified: Secondary | ICD-10-CM | POA: Diagnosis not present

## 2019-09-21 DIAGNOSIS — L578 Other skin changes due to chronic exposure to nonionizing radiation: Secondary | ICD-10-CM | POA: Diagnosis not present

## 2019-09-21 DIAGNOSIS — L821 Other seborrheic keratosis: Secondary | ICD-10-CM | POA: Diagnosis not present

## 2019-09-21 DIAGNOSIS — Z86018 Personal history of other benign neoplasm: Secondary | ICD-10-CM | POA: Diagnosis not present

## 2019-09-21 DIAGNOSIS — D1800 Hemangioma unspecified site: Secondary | ICD-10-CM

## 2019-09-21 DIAGNOSIS — Z1283 Encounter for screening for malignant neoplasm of skin: Secondary | ICD-10-CM | POA: Diagnosis not present

## 2019-09-21 DIAGNOSIS — L409 Psoriasis, unspecified: Secondary | ICD-10-CM | POA: Diagnosis not present

## 2019-09-21 DIAGNOSIS — L814 Other melanin hyperpigmentation: Secondary | ICD-10-CM | POA: Diagnosis not present

## 2019-09-21 DIAGNOSIS — L570 Actinic keratosis: Secondary | ICD-10-CM

## 2019-09-21 MED ORDER — MOMETASONE FUROATE 0.1 % EX CREA: TOPICAL_CREAM | CUTANEOUS | 6 refills | Status: DC

## 2019-09-21 NOTE — Progress Notes (Signed)
   Follow-Up Visit   Subjective  Shelley Sims is a 74 y.o. female who presents for the following: Annual Exam (yearly TBSE hx of Dysplastic nevus ) and Rash (pt c/o psorisasis on her  scalp, using Mometasone cream with a fair response ).  The patient presents for total body skin examination for skin cancer screening and mole check.  The following portions of the chart were reviewed this encounter and updated as appropriate:  Tobacco  Allergies  Meds  Problems  Med Hx  Surg Hx  Fam Hx      Review of Systems:  No other skin or systemic complaints except as noted in HPI or Assessment and Plan.  Objective  Well appearing patient in no apparent distress; mood and affect are within normal limits.  A full examination was performed including scalp, head, eyes, ears, nose, lips, neck, chest, axillae, abdomen, back, buttocks, bilateral upper extremities, bilateral lower extremities, hands, feet, fingers, toes, fingernails, and toenails. All findings within normal limits unless otherwise noted below.  Objective  L upper lip L of midline at vermillion border (2): Erythematous thin papules/macules with gritty scale.   Objective  Right Low back 4 cm lat to spine: Scar with no evidence of recurrence.   Objective  scalp: Pink scaliness    Assessment & Plan    Skin cancer screening performed today.  Actinic Damage - diffuse scaly erythematous macules with underlying dyspigmentation - Recommend daily broad spectrum sunscreen SPF 30+ to sun-exposed areas, reapply every 2 hours as needed.  - Call for new or changing lesions. Seborrheic Keratoses - Stuck-on, waxy, tan-brown papules and plaques  - Discussed benign etiology and prognosis. - Observe - Call for any changes Lentigines - Scattered tan macules - Discussed due to sun exposure - Benign, observe - Call for any changes Melanocytic Nevi - Tan-brown and/or pink-flesh-colored symmetric macules and papules - Benign appearing  on exam today - Observation - Call clinic for new or changing moles - Recommend daily use of broad spectrum spf 30+ sunscreen to sun-exposed areas.  Hemangiomas - Red papules - Discussed benign nature - Observe - Call for any changes  AK (actinic keratosis) (2) L upper lip L of midline at vermillion border  Destruction of lesion - L upper lip L of midline at vermillion border Complexity: simple   Destruction method: cryotherapy   Informed consent: discussed and consent obtained   Timeout:  patient name, date of birth, surgical site, and procedure verified Lesion destroyed using liquid nitrogen: Yes   Region frozen until ice ball extended beyond lesion: Yes   Outcome: patient tolerated procedure well with no complications   Post-procedure details: wound care instructions given    History of dysplastic nevus Right Low back 4 cm lat to spine  Observe   Psoriasis scalp  No cure can only control Cont Mometasone lotion apply to scalp qd-bid prn  mometasone (ELOCON) 0.1 % cream - scalp  Skin cancer screening  Return in about 6 months (around 03/22/2020) for sun exposed areas .  IMarye Round, CMA, am acting as scribe for Sarina Ser, MD .  Documentation: I have reviewed the above documentation for accuracy and completeness, and I agree with the above.  Sarina Ser, MD

## 2019-09-22 DIAGNOSIS — M25642 Stiffness of left hand, not elsewhere classified: Secondary | ICD-10-CM | POA: Insufficient documentation

## 2019-09-24 ENCOUNTER — Encounter: Payer: Self-pay | Admitting: Dermatology

## 2019-09-28 ENCOUNTER — Other Ambulatory Visit: Payer: Self-pay

## 2019-09-29 ENCOUNTER — Other Ambulatory Visit: Payer: Self-pay

## 2019-09-29 ENCOUNTER — Telehealth (INDEPENDENT_AMBULATORY_CARE_PROVIDER_SITE_OTHER): Payer: Self-pay | Admitting: Gastroenterology

## 2019-09-29 DIAGNOSIS — Z1211 Encounter for screening for malignant neoplasm of colon: Secondary | ICD-10-CM

## 2019-09-29 DIAGNOSIS — K219 Gastro-esophageal reflux disease without esophagitis: Secondary | ICD-10-CM

## 2019-09-29 NOTE — Progress Notes (Signed)
Gastroenterology Pre-Procedure Review  Request Date: 10/18/19 Requesting Physician: Dr. Allen Norris  PATIENT REVIEW QUESTIONS: The patient responded to the following health history questions as indicated:    1. Are you having any GI issues? no 2. Do you have a personal history of Polyps? no 3. Do you have a family history of Colon Cancer or Polyps? no 4. Diabetes Mellitus? no 5. Joint replacements in the past 12 months?no 6. Major health problems in the past 3 months?no 7. Any artificial heart valves, MVP, or defibrillator?no    MEDICATIONS & ALLERGIES:    Patient reports the following regarding taking any anticoagulation/antiplatelet therapy:   Plavix, Coumadin, Eliquis, Xarelto, Lovenox, Pradaxa, Brilinta, or Effient? no Aspirin? no  Patient confirms/reports the following medications:  Current Outpatient Medications  Medication Sig Dispense Refill  . ALPRAZolam (XANAX) 0.5 MG tablet TAKE ONE-HALF TO ONE TABLET EVERY EIGHT HOURS AS NEEDED    . azelastine (ASTELIN) 0.1 % nasal spray     . Calcium Carbonate-Vitamin D 600-200 MG-UNIT TABS Take by mouth.    . Calcium-Vitamin D 600-200 MG-UNIT per tablet Take by mouth.    . chlorhexidine (PERIDEX) 0.12 % solution     . Cholecalciferol (VITAMIN D) 400 UNITS capsule Take by mouth.    . clindamycin (CLEOCIN) 300 MG capsule     . cyclobenzaprine (FLEXERIL) 5 MG tablet Take 1 tablet (5 mg total) by mouth 3 (three) times daily as needed for muscle spasms. 30 tablet 0  . Influenza Vac High-Dose Quad (FLUZONE HIGH-DOSE QUADRIVALENT) 0.7 ML SUSY Fluzone High-Dose Quad 2020-21 (PF) 240 mcg/0.7 mL IM syringe    . losartan-hydrochlorothiazide (HYZAAR) 100-12.5 MG per tablet TAKE 1 TABLET EVERY DAY    . mometasone (ELOCON) 0.1 % cream     . mometasone (ELOCON) 0.1 % cream Apply to scalp qd-bid prn 45 g 6  . nitroGLYCERIN (NITRODUR - DOSED IN MG/24 HR) 0.2 mg/hr patch USE 1/4 PATCH FOR 24 HOURS TO THE AFFECTED AREA 30 patch 6  . olmesartan (BENICAR) 40 MG  tablet     . olmesartan-hydrochlorothiazide (BENICAR HCT) 40-12.5 MG per tablet Take by mouth.    [name] omeprazole (PRILOSEC) 20 MG capsule     . oxyCODONE (OXY IR/ROXICODONE) 5 MG immediate release tablet Take 5 mg by mouth every 4 (four) hours as needed.    . predniSONE (DELTASONE) 20 MG tablet     . sulfamethoxazole-trimethoprim (BACTRIM DS) 800-160 MG tablet Take 1 tablet by mouth 2 (two) times daily.    Manus Gunning BOWEL PREP KIT 17.5-3.13-1.6 GM/177ML SOLN See admin instructions.     No current facility-administered medications for this visit.    Patient confirms/reports the following allergies:  Allergies  Allergen Reactions  . Penicillins     REACTION: hives    No orders of the defined types were placed in this encounter.   AUTHORIZATION INFORMATION Primary Insurance: 1D#: Group #:  Secondary Insurance: 1D#: Group #:  SCHEDULE INFORMATION: Date: 10/18/19 Time: Location:[facility]

## 2019-10-14 ENCOUNTER — Other Ambulatory Visit
Admission: RE | Admit: 2019-10-14 | Discharge: 2019-10-14 | Disposition: A | Discharge status: Home / Self Care | Payer: PPO | Source: Ambulatory Visit | Attending: Gastroenterology | Admitting: Gastroenterology

## 2019-10-14 DIAGNOSIS — Z20822 Contact with and (suspected) exposure to covid-19: Secondary | ICD-10-CM | POA: Insufficient documentation

## 2019-10-14 DIAGNOSIS — Z01812 Encounter for preprocedural laboratory examination: Secondary | ICD-10-CM | POA: Diagnosis not present

## 2019-10-14 LAB — SARS CORONAVIRUS 2 (TAT 6-24 HRS): SARS Coronavirus 2: NEGATIVE

## 2019-10-18 ENCOUNTER — Encounter: Payer: Self-pay | Admitting: Gastroenterology

## 2019-10-18 ENCOUNTER — Encounter
Admission: RE | Disposition: A | Discharge status: Home / Self Care | Payer: Self-pay | Source: Home / Self Care | Attending: Gastroenterology

## 2019-10-18 ENCOUNTER — Ambulatory Visit: Payer: PPO | Admitting: Anesthesiology

## 2019-10-18 ENCOUNTER — Ambulatory Visit
Admission: RE | Admit: 2019-10-18 | Discharge: 2019-10-18 | Disposition: A | Discharge status: Home / Self Care | Payer: PPO | Attending: Gastroenterology | Admitting: Gastroenterology

## 2019-10-18 ENCOUNTER — Other Ambulatory Visit: Payer: Self-pay

## 2019-10-18 DIAGNOSIS — I10 Essential (primary) hypertension: Secondary | ICD-10-CM | POA: Insufficient documentation

## 2019-10-18 DIAGNOSIS — K219 Gastro-esophageal reflux disease without esophagitis: Secondary | ICD-10-CM | POA: Diagnosis not present

## 2019-10-18 DIAGNOSIS — Z880 Allergy status to penicillin: Secondary | ICD-10-CM | POA: Diagnosis not present

## 2019-10-18 DIAGNOSIS — Z1211 Encounter for screening for malignant neoplasm of colon: Secondary | ICD-10-CM | POA: Diagnosis not present

## 2019-10-18 DIAGNOSIS — K635 Polyp of colon: Secondary | ICD-10-CM | POA: Diagnosis not present

## 2019-10-18 DIAGNOSIS — Z79899 Other long term (current) drug therapy: Secondary | ICD-10-CM | POA: Diagnosis not present

## 2019-10-18 DIAGNOSIS — F458 Other somatoform disorders: Secondary | ICD-10-CM | POA: Insufficient documentation

## 2019-10-18 DIAGNOSIS — D123 Benign neoplasm of transverse colon: Secondary | ICD-10-CM | POA: Insufficient documentation

## 2019-10-18 DIAGNOSIS — K2970 Gastritis, unspecified, without bleeding: Secondary | ICD-10-CM | POA: Insufficient documentation

## 2019-10-18 DIAGNOSIS — K641 Second degree hemorrhoids: Secondary | ICD-10-CM | POA: Insufficient documentation

## 2019-10-18 HISTORY — PX: COLONOSCOPY WITH PROPOFOL: SHX5780

## 2019-10-18 HISTORY — DX: Essential (primary) hypertension: I10

## 2019-10-18 HISTORY — PX: ESOPHAGOGASTRODUODENOSCOPY (EGD) WITH PROPOFOL: SHX5813

## 2019-10-18 SURGERY — COLONOSCOPY WITH PROPOFOL
Anesthesia: General

## 2019-10-18 MED ORDER — DEXMEDETOMIDINE HCL IN NACL 400 MCG/100ML IV SOLN
INTRAVENOUS | Status: DC | PRN
Start: 2019-10-18 — End: 2019-10-18
  Administered 2019-10-18: 8 ug via INTRAVENOUS

## 2019-10-18 MED ORDER — PROPOFOL 10 MG/ML IV BOLUS
INTRAVENOUS | Status: DC | PRN
  Administered 2019-10-18: 30 mg via INTRAVENOUS
  Administered 2019-10-18: 20 mg via INTRAVENOUS

## 2019-10-18 MED ORDER — LIDOCAINE HCL (CARDIAC) PF 100 MG/5ML IV SOSY
PREFILLED_SYRINGE | INTRAVENOUS | Status: DC | PRN
  Administered 2019-10-18: 60 mg via INTRAVENOUS

## 2019-10-18 MED ORDER — PROPOFOL 500 MG/50ML IV EMUL
INTRAVENOUS | Status: DC | PRN
  Administered 2019-10-18: 100 ug/kg/min via INTRAVENOUS

## 2019-10-18 MED ORDER — EPHEDRINE SULFATE 50 MG/ML IJ SOLN
INTRAMUSCULAR | Status: DC | PRN
  Administered 2019-10-18: 5 mg via INTRAVENOUS

## 2019-10-18 MED ORDER — SODIUM CHLORIDE 0.9 % IV SOLN: INTRAVENOUS | Status: DC

## 2019-10-18 NOTE — Transfer of Care (Signed)
Immediate Anesthesia Transfer of Care Note  Patient: Shelley Sims  Procedure(s) Performed: COLONOSCOPY WITH PROPOFOL (N/A ) ESOPHAGOGASTRODUODENOSCOPY (EGD) WITH PROPOFOL (N/A )  Patient Location: PACU  Anesthesia Type:MAC and General  Level of Consciousness: awake  Airway & Oxygen Therapy: Patient Spontanous Breathing and Patient connected to nasal cannula oxygen  Post-op Assessment: Report given to RN and Post -op Vital signs reviewed and stable  Post vital signs: Reviewed  Last Vitals:  Vitals Value Taken Time  BP 98/61 10/18/19 1027  Temp 36.4 C 10/18/19 1026  Pulse 67 10/18/19 1028  Resp 19 10/18/19 1028  SpO2 100 % 10/18/19 1028  Vitals shown include unvalidated device data.  Last Pain:  Vitals:   10/18/19 1026  TempSrc: Temporal         Complications: No apparent anesthesia complications

## 2019-10-18 NOTE — Op Note (Signed)
[facility] Gastroenterology Patient Name: Shelley Sims Procedure Date: 10/18/2019 9:51 AM MRN: [id number] Account #: [id number] Date of Birth: Feb 26, 1946 Admit Type: Outpatient Age: 74 Room: [facility] ENDO ROOM 4 Gender: Female Note Status: Finalized Procedure:             Upper GI endoscopy Indications:           Globus sensation Providers:             [name] MD, MD Medicines:             Propofol per Anesthesia Complications:         No immediate complications. Procedure:             Pre-Anesthesia Assessment:                        - Prior to the procedure, a History and Physical was                         performed, and patient medications and allergies were                         reviewed. The patient's tolerance of previous                         anesthesia was also reviewed. The risks and benefits                         of the procedure and the sedation options and risks                         were discussed with the patient. All questions were                         answered, and informed consent was obtained. Prior                         Anticoagulants: The patient has taken no previous                         anticoagulant or antiplatelet agents. ASA Grade                         Assessment: II - A patient with mild systemic disease.                         After reviewing the risks and benefits, the patient                         was deemed in satisfactory condition to undergo the                         procedure.                        After obtaining informed consent, the endoscope was                         passed under direct vision. Throughout the procedure,  the patient's blood pressure, pulse, and oxygen                         saturations were monitored continuously. The Endoscope                         was introduced through the mouth, and advanced to the                         second part of duodenum. The  upper GI endoscopy was                         accomplished without difficulty. The patient tolerated                         the procedure well. Findings:      The examined esophagus was normal.      Diffuse moderate inflammation characterized by erosions and erythema was       found in the gastric antrum. Biopsies were taken with a cold forceps for       histology.      The examined duodenum was normal. Impression:            - Normal esophagus.                        - Gastritis. Biopsied.                        - Normal examined duodenum. Recommendation:        - Discharge patient to home.                        - Resume previous diet.                        - Continue present medications.                        - Await pathology results. Procedure Code(s):     --- Professional ---                        [phone], Esophagogastroduodenoscopy, flexible,                         transoral; with biopsy, single or multiple Diagnosis Code(s):     --- Professional ---                        F45.8, Other somatoform disorders                        K29.70, Gastritis, unspecified, without bleeding CPT copyright 2019 American Medical Association. All rights reserved. The codes documented in this report are preliminary and upon coder review may  be revised to meet current compliance requirements. [name] MD, MD 10/18/2019 10:07:30 AM This report has been signed electronically. Number of Addenda: 0 Note Initiated On: 10/18/2019 9:51 AM Estimated Blood Loss:  Estimated blood loss: none.      [facility]

## 2019-10-18 NOTE — Anesthesia Postprocedure Evaluation (Deleted)
Anesthesia Post Note  Patient: Shelley Sims  Procedure(s) Performed: COLONOSCOPY WITH PROPOFOL (N/A ) ESOPHAGOGASTRODUODENOSCOPY (EGD) WITH PROPOFOL (N/A )  Patient location during evaluation: Phase II Anesthesia Type: General and MAC Level of consciousness: awake, awake and alert and oriented Pain management: pain level controlled Vital Signs Assessment: post-procedure vital signs reviewed and stable Respiratory status: spontaneous breathing Cardiovascular status: blood pressure returned to baseline Postop Assessment: no headache Anesthetic complications: no     Last Vitals:  Vitals:   10/18/19 0917 10/18/19 1026  BP: 140/84 98/61  Pulse: 72   Resp: 16 14  Temp: 36.6 C (!) 36.4 C  SpO2: 100%     Last Pain:  Vitals:   10/18/19 1026  TempSrc: Temporal                 Dierdre Forth Disser

## 2019-10-18 NOTE — Anesthesia Postprocedure Evaluation (Signed)
Anesthesia Post Note  Patient: ALAIA KAPOOR  Procedure(s) Performed: COLONOSCOPY WITH PROPOFOL (N/A ) ESOPHAGOGASTRODUODENOSCOPY (EGD) WITH PROPOFOL (N/A )  Patient location during evaluation: Endoscopy Anesthesia Type: General Level of consciousness: awake and alert Pain management: pain level controlled Vital Signs Assessment: post-procedure vital signs reviewed and stable Respiratory status: spontaneous breathing, nonlabored ventilation, respiratory function stable and patient connected to nasal cannula oxygen Cardiovascular status: blood pressure returned to baseline and stable Postop Assessment: no apparent nausea or vomiting Anesthetic complications: no     Last Vitals:  Vitals:   10/18/19 0917 10/18/19 1026  BP: 140/84 98/61  Pulse: 72   Resp: 16 14  Temp: 36.6 C (!) 36.4 C  SpO2: 100%     Last Pain:  Vitals:   10/18/19 1056  TempSrc:   PainSc: 0-No pain                 Martha Clan

## 2019-10-18 NOTE — Op Note (Signed)
[facility] Gastroenterology Patient Name: Shelley Sims Procedure Date: 10/18/2019 9:50 AM MRN: [id number] Account #: [id number] Date of Birth: 1945/06/08 Admit Type: Outpatient Age: 74 Room: [facility] ENDO ROOM 4 Gender: Female Note Status: Finalized Procedure:             Colonoscopy Indications:           Screening for colorectal malignant neoplasm Providers:             [name] MD, MD Medicines:             Propofol per Anesthesia Complications:         No immediate complications. Procedure:             Pre-Anesthesia Assessment:                        - Prior to the procedure, a History and Physical was                         performed, and patient medications and allergies were                         reviewed. The patient's tolerance of previous                         anesthesia was also reviewed. The risks and benefits                         of the procedure and the sedation options and risks                         were discussed with the patient. All questions were                         answered, and informed consent was obtained. Prior                         Anticoagulants: The patient has taken no previous                         anticoagulant or antiplatelet agents. ASA Grade                         Assessment: II - A patient with mild systemic disease.                         After reviewing the risks and benefits, the patient                         was deemed in satisfactory condition to undergo the                         procedure.                        After obtaining informed consent, the colonoscope was                         passed under direct vision. Throughout the procedure,  the patient's blood pressure, pulse, and oxygen                         saturations were monitored continuously. The                         Colonoscope was introduced through the anus and                         advanced to the the cecum,  identified by appendiceal                         orifice and ileocecal valve. The colonoscopy was                         performed without difficulty. The patient tolerated                         the procedure well. The quality of the bowel                         preparation was excellent. Findings:      The perianal and digital rectal examinations were normal.      A 3 mm polyp was found in the transverse colon. The polyp was sessile.       The polyp was removed with a cold biopsy forceps. Resection and       retrieval were complete.      Non-bleeding internal hemorrhoids were found during retroflexion. The       hemorrhoids were Grade II (internal hemorrhoids that prolapse but reduce       spontaneously). Impression:            - One 3 mm polyp in the transverse colon, removed with                         a cold biopsy forceps. Resected and retrieved.                        - Non-bleeding internal hemorrhoids. Recommendation:        - Discharge patient to home.                        - Resume previous diet.                        - Continue present medications.                        - Await pathology results.                        - Repeat colonoscopy in 5 years if polyp adenoma and                         10 years if hyperplastic Procedure Code(s):     --- Professional ---                        [phone], Colonoscopy, flexible; with biopsy, single or  multiple Diagnosis Code(s):     --- Professional ---                        Z12.11, Encounter for screening for malignant neoplasm                         of colon                        K63.5, Polyp of colon CPT copyright 2019 American Medical Association. All rights reserved. The codes documented in this report are preliminary and upon coder review may  be revised to meet current compliance requirements. [name] MD, MD 10/18/2019 10:26:30 AM This report has been signed electronically. Number of  Addenda: 0 Note Initiated On: 10/18/2019 9:50 AM Scope Withdrawal Time: 0 hours 7 minutes 57 seconds  Total Procedure Duration: 0 hours 14 minutes 15 seconds  Estimated Blood Loss:  Estimated blood loss: none.      [facility]

## 2019-10-18 NOTE — Anesthesia Preprocedure Evaluation (Addendum)
Anesthesia Evaluation  Patient identified by MRN, date of birth, ID band Patient awake    Reviewed: Allergy & Precautions, H&P , NPO status , Patient's Chart, lab work & pertinent test results, reviewed documented beta blocker date and time   History of Anesthesia Complications Negative for: history of anesthetic complications  Airway Mallampati: I  TM Distance: >3 FB Neck ROM: full    Dental  (+) Caps, Dental Advidsory Given, Teeth Intact   Pulmonary neg pulmonary ROS,    Pulmonary exam normal breath sounds clear to auscultation       Cardiovascular Exercise Tolerance: Good hypertension, (-) angina(-) Past MI and (-) Cardiac Stents Normal cardiovascular exam(-) dysrhythmias (-) Valvular Problems/Murmurs Rhythm:regular Rate:Normal     Neuro/Psych negative neurological ROS  negative psych ROS   GI/Hepatic negative GI ROS, Neg liver ROS,   Endo/Other  negative endocrine ROS  Renal/GU negative Renal ROS  negative genitourinary   Musculoskeletal   Abdominal   Peds  Hematology negative hematology ROS (+)   Anesthesia Other Findings Past Medical History: 09/03/2017: Atypical mole     Comment:  right low back 4.0cm lat to spine No date: Hypertension   Reproductive/Obstetrics negative OB ROS                             Anesthesia Physical Anesthesia Plan  ASA: II  Anesthesia Plan: General   Post-op Pain Management:    Induction: Intravenous  PONV Risk Score and Plan: 3 and Propofol infusion and TIVA  Airway Management Planned: Natural Airway and Nasal Cannula  Additional Equipment:   Intra-op Plan:   Post-operative Plan:   Informed Consent: I have reviewed the patients History and Physical, chart, labs and discussed the procedure including the risks, benefits and alternatives for the proposed anesthesia with the patient or authorized representative who has indicated his/her  understanding and acceptance.     Dental Advisory Given  Plan Discussed with: Anesthesiologist, CRNA and Surgeon  Anesthesia Plan Comments:        Anesthesia Quick Evaluation

## 2019-10-18 NOTE — H&P (Addendum)
Shelley Lame, MD [address] [place], [place] [address] Phone: [phone] Fax : [phone]  Primary Care Physician:  [place] Pink, MD Primary Gastroenterologist:  Dr. Allen Norris  Pre-Procedure History & Physical: HPI:  Shelley Sims is a 74 y.o. female is here for a screening colonoscopy and EGD.   Past Medical History:  Diagnosis Date  . Atypical mole 09/03/2017   right low back 4.0cm lat to spine  . Hypertension     Past Surgical History:  Procedure Laterality Date  . COLONOSCOPY    . FOOT SURGERY      Prior to Admission medications   Medication Sig Start Date End Date Taking? Authorizing Provider  ALPRAZolam (XANAX) 0.5 MG tablet TAKE ONE-HALF TO ONE TABLET EVERY EIGHT HOURS AS NEEDED 07/13/14  Yes [provider]  Calcium Carbonate-Vitamin D 600-200 MG-UNIT TABS Take by mouth.   Yes [provider]  Calcium-Vitamin D 600-200 MG-UNIT per tablet Take by mouth.   Yes [provider]  Cholecalciferol (VITAMIN D) 400 UNITS capsule Take by mouth.   Yes [provider]  olmesartan-hydrochlorothiazide (BENICAR HCT) 40-12.5 MG per tablet Take by mouth. 01/17/14  Yes [provider]  azelastine (ASTELIN) 0.1 % nasal spray  01/06/19   [provider]  chlorhexidine (PERIDEX) 0.12 % solution  07/19/15   [provider]  clindamycin (CLEOCIN) 300 MG capsule  07/19/15   [provider]  cyclobenzaprine (FLEXERIL) 5 MG tablet Take 1 tablet (5 mg total) by mouth 3 (three) times daily as needed for muscle spasms. 01/25/14   Pick-Jacobs, John C, DO  Influenza Vac High-Dose Quad (FLUZONE HIGH-DOSE QUADRIVALENT) 0.7 ML SUSY Fluzone High-Dose Quad 2020-21 (PF) 240 mcg/0.7 mL IM syringe    [provider]  losartan-hydrochlorothiazide (HYZAAR) 100-12.5 MG per tablet TAKE 1 TABLET EVERY DAY 08/22/14   [provider]  mometasone (ELOCON) 0.1 % cream  01/03/15   [provider]  mometasone  (ELOCON) 0.1 % cream Apply to scalp qd-bid prn 09/21/19   Ralene Bathe, MD  nitroGLYCERIN (NITRODUR - DOSED IN MG/24 HR) 0.2 mg/hr patch USE 1/4 PATCH FOR 24 HOURS TO THE AFFECTED AREA 10/12/18   Stefanie Libel, MD  olmesartan [facility]) 40 MG tablet  12/01/18   [provider]  omeprazole (PRILOSEC) 20 MG capsule  01/06/19   [provider]  oxyCODONE (OXY IR/ROXICODONE) 5 MG immediate release tablet Take 5 mg by mouth every 4 (four) hours as needed. 09/08/19   [provider]  predniSONE (DELTASONE) 20 MG tablet  01/03/15   [provider]  sulfamethoxazole-trimethoprim (BACTRIM DS) 800-160 MG tablet Take 1 tablet by mouth 2 (two) times daily. 09/08/19   [provider]  SUPREP BOWEL PREP KIT 17.5-3.13-1.6 GM/177ML SOLN See admin instructions. 07/21/19   [provider]    Allergies as of 09/29/2019 - Review Complete 09/29/2019  Allergen Reaction Noted  . Penicillins  03/09/2008    History reviewed. No pertinent family history.  Social History   Socioeconomic History  . Marital status: Married    Spouse name: Not on file  . Number of children: Not on file  . Years of education: Not on file  . Highest education level: Not on file  Occupational History  . Not on file  Tobacco Use  . Smoking status: Never Smoker  . Smokeless tobacco: Never Used  Substance and Sexual Activity  . Alcohol use: Yes  . Drug use: Never  . Sexual activity: Not  on file  Other Topics Concern  . Not on file  Social History Narrative  . Not on file   Social Determinants of Health   Financial Resource Strain:   . Difficulty of Paying Living Expenses:   Food Insecurity:   . Worried About Charity fundraiser in the Last Year:   . Arboriculturist in the Last Year:   Transportation Needs:   . Film/video editor (Medical):   [name] Lack of Transportation (Non-Medical):   Physical Activity:   . Days of Exercise per Week:   . Minutes of Exercise per  Session:   Stress:   . Feeling of Stress :   Social Connections:   . Frequency of Communication with Friends and Family:   . Frequency of Social Gatherings with Friends and Family:   . Attends Religious Services:   . Active Member of Clubs or Organizations:   . Attends Archivist Meetings:   [name] Marital Status:   Intimate Partner Violence:   . Fear of Current or Ex-Partner:   . Emotionally Abused:   [name] Physically Abused:   . Sexually Abused:     Review of Systems: See HPI, otherwise negative ROS  Physical Exam: BP 140/84   Pulse 72   Temp 97.8 F (36.6 C) (Temporal)   Resp 16   Ht 5' 7.5" (1.715 m)   Wt 66.7 kg   SpO2 100%   BMI 22.68 kg/m  General:   Alert,  pleasant and cooperative in NAD Head:  Normocephalic and atraumatic. Neck:  Supple; no masses or thyromegaly. Lungs:  Clear throughout to auscultation.    Heart:  Regular rate and rhythm. Abdomen:  Soft, nontender and nondistended. Normal bowel sounds, without guarding, and without rebound.   Neurologic:  Alert and  oriented x4;  grossly normal neurologically.  Impression/Plan: [name] is now here to undergo a screening colonoscopy and EGD.  Risks, benefits, and alternatives regarding colonoscopy and EGD have been reviewed with the patient.  Questions have been answered.  All parties agreeable.

## 2019-10-19 ENCOUNTER — Encounter: Payer: Self-pay | Admitting: Gastroenterology

## 2019-10-19 ENCOUNTER — Encounter: Payer: Self-pay | Admitting: *Deleted

## 2019-10-19 LAB — SURGICAL PATHOLOGY

## 2019-12-26 ENCOUNTER — Ambulatory Visit: Payer: PPO | Admitting: Dermatology

## 2019-12-26 ENCOUNTER — Other Ambulatory Visit: Payer: Self-pay

## 2019-12-26 DIAGNOSIS — L820 Inflamed seborrheic keratosis: Secondary | ICD-10-CM

## 2019-12-26 DIAGNOSIS — L821 Other seborrheic keratosis: Secondary | ICD-10-CM

## 2019-12-26 DIAGNOSIS — L578 Other skin changes due to chronic exposure to nonionizing radiation: Secondary | ICD-10-CM | POA: Diagnosis not present

## 2019-12-26 DIAGNOSIS — L570 Actinic keratosis: Secondary | ICD-10-CM | POA: Diagnosis not present

## 2019-12-26 NOTE — Progress Notes (Signed)
   Follow-Up Visit   Subjective  Shelley Sims is a 74 y.o. female who presents for the following: Lesion (R nose - has been there about 6 months, scaly, rough). She has noticed lesions on her lower legs and she would like them checked.  The following portions of the chart were reviewed this encounter and updated as appropriate:  Tobacco  Allergies  Meds  Problems  Med Hx  Surg Hx  Fam Hx     Review of Systems:  No other skin or systemic complaints except as noted in HPI or Assessment and Plan.  Objective  Well appearing patient in no apparent distress; mood and affect are within normal limits.  A focused examination was performed including the face and lower legs. Relevant physical exam findings are noted in the Assessment and Plan.  Objective  R nose: Erythematous thin papules/macules with gritty scale.   Objective  R lower leg (3): Erythematous keratotic or waxy stuck-on papule or plaque.   Assessment & Plan  AK (actinic keratosis) R nose  If persistent at follow up appointment - consider biopsy   Destruction of lesion - R nose Complexity: simple   Destruction method: cryotherapy   Informed consent: discussed and consent obtained   Timeout:  patient name, date of birth, surgical site, and procedure verified Lesion destroyed using liquid nitrogen: Yes   Region frozen until ice ball extended beyond lesion: Yes   Outcome: patient tolerated procedure well with no complications   Post-procedure details: wound care instructions given    Inflamed seborrheic keratosis (3) R lower leg  Destruction of lesion - R lower leg Complexity: simple   Destruction method: cryotherapy   Informed consent: discussed and consent obtained   Timeout:  patient name, date of birth, surgical site, and procedure verified Lesion destroyed using liquid nitrogen: Yes   Region frozen until ice ball extended beyond lesion: Yes   Outcome: patient tolerated procedure well with no complications    Post-procedure details: wound care instructions given    Actinic Damage - diffuse scaly erythematous macules with underlying dyspigmentation - Recommend daily broad spectrum sunscreen SPF 30+ to sun-exposed areas, reapply every 2 hours as needed.  - Call for new or changing lesions.  Seborrheic Keratoses - Stuck-on, waxy, tan-brown papules and plaques  - Discussed benign etiology and prognosis. - Observe - Call for any changes  Return for appointment as scheduled.  Luther Redo, CMA, am acting as scribe for Sarina Ser, MD .  Documentation: I have reviewed the above documentation for accuracy and completeness, and I agree with the above.  Sarina Ser, MD

## 2019-12-27 ENCOUNTER — Encounter: Payer: Self-pay | Admitting: Dermatology

## 2019-12-29 DIAGNOSIS — Z1231 Encounter for screening mammogram for malignant neoplasm of breast: Secondary | ICD-10-CM | POA: Diagnosis not present

## 2020-01-25 DIAGNOSIS — H353131 Nonexudative age-related macular degeneration, bilateral, early dry stage: Secondary | ICD-10-CM | POA: Diagnosis not present

## 2020-02-13 ENCOUNTER — Ambulatory Visit (INDEPENDENT_AMBULATORY_CARE_PROVIDER_SITE_OTHER): Payer: Self-pay

## 2020-02-13 ENCOUNTER — Other Ambulatory Visit: Payer: Self-pay

## 2020-02-13 DIAGNOSIS — L578 Other skin changes due to chronic exposure to nonionizing radiation: Secondary | ICD-10-CM

## 2020-02-13 DIAGNOSIS — Z872 Personal history of diseases of the skin and subcutaneous tissue: Secondary | ICD-10-CM

## 2020-02-13 DIAGNOSIS — I781 Nevus, non-neoplastic: Secondary | ICD-10-CM

## 2020-02-13 NOTE — Progress Notes (Signed)
CC: Redness, brown spots, wrinkles and dry skin. Plan BBL 3 treatments spaced 4-6 weeks apart. jj  Consult: Recommended BBL, risks and benefits reviewed. Pt has a hx of fever blisters and an active tan. She will need to be out of the sun for at least 4 weeks prior to tx. Recommended Elta Sheer SPF 50 and The Perfect A 2-3 nights a week with a moisturizer. HydroBoost samples and info given. We are currently out of Perfect A. She will be notified when it is back in stock. jj

## 2020-02-22 DIAGNOSIS — E041 Nontoxic single thyroid nodule: Secondary | ICD-10-CM | POA: Diagnosis not present

## 2020-02-22 DIAGNOSIS — K219 Gastro-esophageal reflux disease without esophagitis: Secondary | ICD-10-CM | POA: Diagnosis not present

## 2020-02-27 ENCOUNTER — Other Ambulatory Visit: Payer: Self-pay

## 2020-02-27 ENCOUNTER — Ambulatory Visit (INDEPENDENT_AMBULATORY_CARE_PROVIDER_SITE_OTHER): Payer: PPO | Admitting: Dermatology

## 2020-02-27 DIAGNOSIS — D692 Other nonthrombocytopenic purpura: Secondary | ICD-10-CM

## 2020-02-27 DIAGNOSIS — D485 Neoplasm of uncertain behavior of skin: Secondary | ICD-10-CM | POA: Diagnosis not present

## 2020-02-27 DIAGNOSIS — Z85828 Personal history of other malignant neoplasm of skin: Secondary | ICD-10-CM

## 2020-02-27 DIAGNOSIS — L820 Inflamed seborrheic keratosis: Secondary | ICD-10-CM

## 2020-02-27 DIAGNOSIS — C44311 Basal cell carcinoma of skin of nose: Secondary | ICD-10-CM | POA: Diagnosis not present

## 2020-02-27 HISTORY — DX: Personal history of other malignant neoplasm of skin: Z85.828

## 2020-02-27 NOTE — Progress Notes (Addendum)
   Follow-Up Visit   Subjective  Shelley Sims is a 74 y.o. female who presents for the following: Follow-up. Patient here today for 2 month AK follow up at right nose, also ISK follow up at right lower leg.  Patient also seen for psoriasis at the scalp currently using mometasone.  She is concerned about an indentation at the AKA site on her right nose.  She is worried about the area.  The following portions of the chart were reviewed this encounter and updated as appropriate:  Tobacco  Allergies  Meds  Problems  Med Hx  Surg Hx  Fam Hx     Review of Systems:  No other skin or systemic complaints except as noted in HPI or Assessment and Plan.  Objective  Well appearing patient in no apparent distress; mood and affect are within normal limits.  A focused examination was performed including scalp, face, legs. Relevant physical exam findings are noted in the Assessment and Plan.  Objective  Right Nasal Sidewall: Indentation right nasal alar crease approximately 0.5cm       Objective  Right lower leg x 3 (3): Erythematous keratotic or waxy stuck-on papule or plaque.    Assessment & Plan  Neoplasm of uncertain behavior of skin -rule out basal cell carcinoma Right Nasal Sidewall  Skin / nail biopsy Type of biopsy: punch   Informed consent: discussed and consent obtained   Timeout: patient name, date of birth, surgical site, and procedure verified   Procedure prep:  Patient was prepped and draped in usual sterile fashion (the patient was cleaned and prepped) Prep type:  Isopropyl alcohol Anesthesia: the lesion was anesthetized in a standard fashion   Anesthetic:  1% lidocaine w/ epinephrine 1-100,000 buffered w/ 8.4% NaHCO3 Punch biopsy size: 1.11mm. Suture removal (days):  7 Hemostasis achieved with: pressure, aluminum chloride and Gelfoam   Outcome: patient tolerated procedure well   Post-procedure details: sterile dressing applied and wound care instructions given     Dressing type: bandage, petrolatum and pressure dressing    Specimen 1 - Surgical pathology Differential Diagnosis: r/o BCC Check Margins: No Indentation right nasal alar crease approximately 0.5cm  Inflamed seborrheic keratosis (3) Right lower leg x 3  Destruction of lesion - Right lower leg x 3 Complexity: simple   Destruction method: cryotherapy   Informed consent: discussed and consent obtained   Timeout:  patient name, date of birth, surgical site, and procedure verified Lesion destroyed using liquid nitrogen: Yes   Region frozen until ice ball extended beyond lesion: Yes   Outcome: patient tolerated procedure well with no complications   Post-procedure details: wound care instructions given    Purpura - Violaceous macules and patches - Benign - Related to age, sun damage and/or use of blood thinners - Observe - Can use OTC arnica containing moisturizer such as Dermend Bruise Formula if desired - Call for worsening or other concerns  Scalp psoriasis doing well and mostly clear today.  Continue mometasone as needed.  Return in about 6 months (around 08/27/2020) for TBSE.  Graciella Belton, RMA, am acting as scribe for Sarina Ser, MD . Documentation: I have reviewed the above documentation for accuracy and completeness, and I agree with the above.  Sarina Ser, MD

## 2020-02-27 NOTE — Patient Instructions (Addendum)

## 2020-02-28 ENCOUNTER — Encounter: Payer: Self-pay | Admitting: Dermatology

## 2020-03-01 ENCOUNTER — Other Ambulatory Visit: Payer: Self-pay

## 2020-03-01 ENCOUNTER — Telehealth: Payer: Self-pay

## 2020-03-01 DIAGNOSIS — C44311 Basal cell carcinoma of skin of nose: Secondary | ICD-10-CM

## 2020-03-01 NOTE — Telephone Encounter (Signed)
-----   Message from Ralene Bathe, MD sent at 02/29/2020  6:39 PM EDT ----- Skin , right nasal sidewall BASAL CELL CARCINOMA, NODULAR AND INFILTRATIVE PATTERNS  Cancer - BCC Infiltrative Schedule for MOHS

## 2020-03-01 NOTE — Telephone Encounter (Signed)
Left message on voicemail to return my call.  

## 2020-03-01 NOTE — Telephone Encounter (Signed)
Advised patient of results and will refer to [facility]. Patient prefers to go to Dr. Manley Mason at [facility].

## 2020-03-05 ENCOUNTER — Encounter: Payer: Self-pay | Admitting: Dermatology

## 2020-03-05 ENCOUNTER — Other Ambulatory Visit: Payer: Self-pay

## 2020-03-05 ENCOUNTER — Ambulatory Visit (INDEPENDENT_AMBULATORY_CARE_PROVIDER_SITE_OTHER): Payer: PPO | Admitting: Dermatology

## 2020-03-05 DIAGNOSIS — L409 Psoriasis, unspecified: Secondary | ICD-10-CM | POA: Diagnosis not present

## 2020-03-05 DIAGNOSIS — C44311 Basal cell carcinoma of skin of nose: Secondary | ICD-10-CM

## 2020-03-05 NOTE — Progress Notes (Signed)
   Follow-Up Visit   Subjective  Shelley Sims is a 74 y.o. female who presents for the following: Discuss pathology results and treatment options (biopsy proven infiltrative BCC of the R nasal sidewall - patient is here today to discuss treatment options) and psoriasis (of the scalp - patient currently using Mometasone solution but currently flared).  The following portions of the chart were reviewed this encounter and updated as appropriate:  Tobacco  Allergies  Meds  Problems  Med Hx  Surg Hx  Fam Hx     Review of Systems:  No other skin or systemic complaints except as noted in HPI or Assessment and Plan.  Objective  Well appearing patient in no apparent distress; mood and affect are within normal limits.  A focused examination was performed including the scalp and face . Relevant physical exam findings are noted in the Assessment and Plan.  Objective  R nasal sidewall: Healing biopsy site.  Objective  Scalp: Well-demarcated erythematous papules/plaques with silvery scale.    Assessment & Plan  Basal cell carcinoma (BCC) of skin of nose R nasal sidewall Biopsy proven - BASAL CELL CARCINOMA, NODULAR AND INFILTRATIVE PATTERNS Discussed Mohs recommendation for infiltrative basal cell of the nose. Referral made for MOHS.   patient to contact our office if she hasn't heard from Dr. [place] office by Thursday of this week.   Psoriasis Scalp Currently flared -  Continue Mometasone solution to aa's QHS x 2 weeks. Then decrease use to QHS 5d/qk.   Sample given of a stronger treatment of Lexette foam - apply to aa's QD PRN when resistant to mometasone or with flare.  Consider adding Sorilux foam if patient continues to flare.   Other Related Medications mometasone (ELOCON) 0.1 % cream  Return in about 6 months (around 09/03/2020) for TBSE.  Luther Redo, CMA, am acting as scribe for Sarina Ser, MD .  Documentation: I have reviewed the above documentation for  accuracy and completeness, and I agree with the above.  Sarina Ser, MD

## 2020-03-13 ENCOUNTER — Other Ambulatory Visit: Payer: Self-pay

## 2020-03-13 DIAGNOSIS — C44311 Basal cell carcinoma of skin of nose: Secondary | ICD-10-CM

## 2020-03-13 NOTE — Progress Notes (Signed)
MOHs referral to [facility]

## 2020-04-30 ENCOUNTER — Ambulatory Visit: Payer: PPO

## 2020-05-09 DIAGNOSIS — L988 Other specified disorders of the skin and subcutaneous tissue: Secondary | ICD-10-CM | POA: Diagnosis not present

## 2020-05-09 DIAGNOSIS — L578 Other skin changes due to chronic exposure to nonionizing radiation: Secondary | ICD-10-CM | POA: Diagnosis not present

## 2020-05-09 DIAGNOSIS — L814 Other melanin hyperpigmentation: Secondary | ICD-10-CM | POA: Diagnosis not present

## 2020-05-09 DIAGNOSIS — C44311 Basal cell carcinoma of skin of nose: Secondary | ICD-10-CM | POA: Diagnosis not present

## 2020-06-14 DIAGNOSIS — E559 Vitamin D deficiency, unspecified: Secondary | ICD-10-CM | POA: Diagnosis not present

## 2020-06-14 DIAGNOSIS — I10 Essential (primary) hypertension: Secondary | ICD-10-CM | POA: Diagnosis not present

## 2020-06-18 DIAGNOSIS — E785 Hyperlipidemia, unspecified: Secondary | ICD-10-CM | POA: Diagnosis not present

## 2020-06-18 DIAGNOSIS — Z0000 Encounter for general adult medical examination without abnormal findings: Secondary | ICD-10-CM | POA: Diagnosis not present

## 2020-06-18 DIAGNOSIS — R32 Unspecified urinary incontinence: Secondary | ICD-10-CM | POA: Diagnosis not present

## 2020-06-18 DIAGNOSIS — E559 Vitamin D deficiency, unspecified: Secondary | ICD-10-CM | POA: Diagnosis not present

## 2020-06-18 DIAGNOSIS — I10 Essential (primary) hypertension: Secondary | ICD-10-CM | POA: Diagnosis not present

## 2020-06-25 ENCOUNTER — Ambulatory Visit: Payer: PPO | Admitting: Urology

## 2020-06-27 ENCOUNTER — Ambulatory Visit: Payer: Self-pay | Admitting: Urology

## 2020-07-05 ENCOUNTER — Ambulatory Visit: Payer: Self-pay | Admitting: Urology

## 2020-07-23 ENCOUNTER — Ambulatory Visit: Payer: PPO | Admitting: Urology

## 2020-07-27 DIAGNOSIS — H353131 Nonexudative age-related macular degeneration, bilateral, early dry stage: Secondary | ICD-10-CM | POA: Diagnosis not present

## 2020-08-02 DIAGNOSIS — D485 Neoplasm of uncertain behavior of skin: Secondary | ICD-10-CM | POA: Diagnosis not present

## 2020-08-13 ENCOUNTER — Ambulatory Visit: Payer: PPO | Admitting: Sports Medicine

## 2020-08-13 ENCOUNTER — Ambulatory Visit
Admission: RE | Admit: 2020-08-13 | Discharge: 2020-08-13 | Disposition: A | Discharge status: Home / Self Care | Payer: PPO | Source: Ambulatory Visit | Attending: Sports Medicine | Admitting: Sports Medicine

## 2020-08-13 ENCOUNTER — Other Ambulatory Visit: Payer: Self-pay

## 2020-08-13 ENCOUNTER — Encounter: Payer: Self-pay | Admitting: Sports Medicine

## 2020-08-13 VITALS — BP 110/60 | Ht 67.5 in | Wt 148.0 lb

## 2020-08-13 DIAGNOSIS — M67912 Unspecified disorder of synovium and tendon, left shoulder: Secondary | ICD-10-CM

## 2020-08-13 DIAGNOSIS — M19019 Primary osteoarthritis, unspecified shoulder: Secondary | ICD-10-CM | POA: Diagnosis not present

## 2020-08-13 DIAGNOSIS — M19012 Primary osteoarthritis, left shoulder: Secondary | ICD-10-CM | POA: Diagnosis not present

## 2020-08-13 DIAGNOSIS — I10 Essential (primary) hypertension: Secondary | ICD-10-CM | POA: Diagnosis not present

## 2020-08-13 DIAGNOSIS — R350 Frequency of micturition: Secondary | ICD-10-CM | POA: Diagnosis not present

## 2020-08-13 DIAGNOSIS — M25512 Pain in left shoulder: Secondary | ICD-10-CM

## 2020-08-13 DIAGNOSIS — R300 Dysuria: Secondary | ICD-10-CM | POA: Diagnosis not present

## 2020-08-13 DIAGNOSIS — R8290 Unspecified abnormal findings in urine: Secondary | ICD-10-CM | POA: Diagnosis not present

## 2020-08-13 NOTE — Progress Notes (Signed)
   Subjective:    Patient ID: Shelley Sims, female    DOB: Nov 05, 1945, 75 y.o.   MRN: [id number]  HPI chief complaint: Left shoulder pain  Very pleasant left-hand-dominant 75 year old female comes in today complaining of left shoulder pain.  She has a history of left shoulder osteoarthritis and a partial-thickness rotator cuff tear treated by Dr. Oneida Alar in 2018.  She improved with topical nitroglycerin and home exercises.  Has done fairly well up until about 3 to 4 weeks ago.  Pain began to return.  She describes a tightness around the shoulder with some radiating pain into the biceps area.  Despite her pain she is continued to play tennis.  She denies any new injury.  She is tried ice and Advil which have helped some.  She has a little bit of pain at night.  She saw Dr. Theda Sers approximately 6 or 7 years ago for a surgical opinion and he recommended shoulder replacement at that time which the patient elected to not pursue.  No numbness or tingling.  She has resumed using her nitroglycerin patch and has noticed some improvement in her pain.  Past medical history reviewed Medications reviewed Allergies reviewed   Review of Systems As above    Objective:   Physical Exam Well-developed, well-nourished.  No acute distress  Left shoulder: Patient has fairly well-preserved active and passive range of motion although there is about 20 degrees of passive external rotation on the left compared to the right.  No tenderness to palpation over the acromioclavicular joint nor over the biceps tendon.  Her rotator cuff strength is 5/5.  She does have pain with empty can testing.  Pain with O'Brien's as well.  Negative speeds, negative Yergason's.  Neurovascularly intact distally.  X-rays of her left shoulder including AP and axillary views show moderately advanced glenohumeral osteoarthritis       Assessment & Plan:   Left shoulder pain secondary to glenohumeral osteoarthritis versus chronic rotator cuff  tendinopathy/partial tearing  Patient responded very well historically to topical nitroglycerin and home exercises.  We provided her with a new copy of the nitroglycerin protocol.  She has plenty of patches at home.  She will utilize a quarter patch daily and she will resume her Jobe home exercises.  I would like for her to return to the office soon for a complete left shoulder ultrasound.  Previous ultrasound done by Dr. Oneida Alar in 2018 showed tearing of the distal supraspinatus tendon.  We will delineate further work-up and treatment based on those ultrasound findings.

## 2020-08-13 NOTE — Patient Instructions (Signed)

## 2020-08-14 ENCOUNTER — Other Ambulatory Visit: Payer: Self-pay

## 2020-08-14 ENCOUNTER — Ambulatory Visit (INDEPENDENT_AMBULATORY_CARE_PROVIDER_SITE_OTHER): Payer: PPO | Admitting: Sports Medicine

## 2020-08-14 ENCOUNTER — Ambulatory Visit: Payer: Self-pay

## 2020-08-14 VITALS — BP 118/76 | Ht 67.5 in | Wt 148.0 lb

## 2020-08-14 DIAGNOSIS — M25512 Pain in left shoulder: Secondary | ICD-10-CM

## 2020-08-14 NOTE — Progress Notes (Addendum)
Complete US Left Shoulder: -Biceps tendon: Well visualized within the bicipital groove in short and long axis.  There is calcification in the proximal biceps, no bullseye sign. -Pectoralis: Insertion visualized and without abnormalities. -Subscapularis: Well visualized to insertion point on humerus.  No abnormalities.  Dynamic testing over the coracoid did not show signs of impingement. -AC joint: Mild osteophytes and positive for mushroom sign, no significant separation -Supraspinatus: Small area of hyperechoic calcification in the anterior fibers at the insertion of the supraspinatus.  Dynamic testing did not reveal signs of impingement. -Subacromial bursa: No bursal swelling -Infraspinatus/teres minor: Insertion point on posterior humerus visualized and without abnormalities. Conclusion: Largely normal ultrasound of the left shoulder with changes at the anterior insertion of the supraspinatous consistent with chronic changes and calcifications from previous injury. No acute tears seen today.  Plan: Patient given resistance bands and HEP for rotator cuff today. She will continue using Nitro patches and follow up in 4 weeks.  Harolyn Rutherford, DO [facility] Fellow, PGY-4  Patient seen and evaluated with the sports medicine fellow.  I agree with the above plan of care.  Today's ultrasound does show some chronic changes at the anterior leading edge of the supraspinatus tendon but we do not appreciate any significant full-thickness tears.  Dominant finding on imaging thus far has been advanced glenohumeral DJD and I think a lot of her symptoms are originating from that.  Since she has had such good success with nitroglycerin patches in the past, I have encouraged her to continue with those until follow-up with me in 1 month.  She will also continue with her Jobe home exercises.  I think she is okay to continue with all activities using pain as her guide, including tennis.  Call with questions or  concerns prior to her follow-up visit.

## 2020-08-20 ENCOUNTER — Ambulatory Visit: Payer: PPO | Admitting: Urology

## 2020-08-20 ENCOUNTER — Encounter: Payer: Self-pay | Admitting: Urology

## 2020-08-20 ENCOUNTER — Other Ambulatory Visit: Payer: Self-pay

## 2020-08-20 VITALS — BP 128/78 | HR 66 | Ht 67.0 in | Wt 148.0 lb

## 2020-08-20 DIAGNOSIS — R32 Unspecified urinary incontinence: Secondary | ICD-10-CM | POA: Diagnosis not present

## 2020-08-20 MED ORDER — MIRABEGRON ER 50 MG PO TB24
50.0000 mg | ORAL_TABLET | Freq: Every day | ORAL | 11 refills | Status: DC
Start: 2020-08-20 — End: 2020-12-10

## 2020-08-20 NOTE — Progress Notes (Signed)
08/20/2020 2:11 PM   Shelley Sims 1946/02/23 [id number]  Referring provider: Maryland Pink, MD [address] [facility] [place],  [place] [address]  Chief Complaint  Patient presents with  . Urinary Incontinence    HPI: Patient came in for consultation regarding frequency.  She voids every hour and morning and then every 2 hours.  She gets up twice at night.  Sometimes she leaks with urgency and coughing but does not wear a pad.  Sometimes she has mild bedwetting.  Last week she had a lot of frequency and pressure and was told she had microscopic hematuria and a negative culture.  With fluids things are improving.  She was not treated with an antibiotic  She is not had a hysterectomy.  No history of kidney stones bladder surgery bladder infections.  No neurologic issues.  No previous treatment.  Bowel movements normal   PMH: Past Medical History:  Diagnosis Date  . Atypical mole 09/03/2017   right low back 4.0cm lat to spine  . History of basal cell carcinoma (BCC) 02/27/2020   right nasal sidewall  . Hypertension     Surgical History: Past Surgical History:  Procedure Laterality Date  . COLONOSCOPY    . COLONOSCOPY WITH PROPOFOL N/A 10/18/2019   Procedure: COLONOSCOPY WITH PROPOFOL;  Surgeon: [name], MD;  Location: [facility] ENDOSCOPY;  Service: Endoscopy;  Laterality: N/A;  . ESOPHAGOGASTRODUODENOSCOPY (EGD) WITH PROPOFOL N/A 10/18/2019   Procedure: ESOPHAGOGASTRODUODENOSCOPY (EGD) WITH PROPOFOL;  Surgeon: [name], MD;  Location: [facility] ENDOSCOPY;  Service: Endoscopy;  Laterality: N/A;  . FOOT SURGERY      Home Medications:  Allergies as of 08/20/2020      Reactions   Penicillins    REACTION: hives      Medication List       Accurate as of August 20, 2020  2:11 PM. If you have any questions, ask your nurse or doctor.        ALPRAZolam 0.5 MG tablet Commonly known as: XANAX TAKE ONE-HALF TO ONE TABLET EVERY EIGHT HOURS AS NEEDED   azelastine  0.1 % nasal spray Commonly known as: ASTELIN   Calcium Carbonate-Vitamin D 600-200 MG-UNIT Tabs Take by mouth.   Calcium-Vitamin D 600-200 MG-UNIT tablet Take by mouth.   chlorhexidine 0.12 % solution Commonly known as: PERIDEX   clindamycin 300 MG capsule Commonly known as: CLEOCIN   cyclobenzaprine 5 MG tablet Commonly known as: Flexeril Take 1 tablet (5 mg total) by mouth 3 (three) times daily as needed for muscle spasms.   Fluzone High-Dose Quadrivalent 0.7 ML Susy Generic drug: Influenza Vac High-Dose Quad Fluzone High-Dose Quad 2020-21 (PF) 240 mcg/0.7 mL IM syringe   losartan-hydrochlorothiazide 100-12.5 MG tablet Commonly known as: HYZAAR TAKE 1 TABLET EVERY DAY   mometasone 0.1 % cream Commonly known as: ELOCON   mometasone 0.1 % cream Commonly known as: ELOCON Apply to scalp qd-bid prn   nitroGLYCERIN 0.2 mg/hr patch Commonly known as: NITRODUR - Dosed in mg/24 hr USE 1/4 PATCH FOR 24 HOURS TO THE AFFECTED AREA   olmesartan 40 MG tablet Commonly known as: BENICAR   olmesartan-hydrochlorothiazide 40-12.5 MG tablet Commonly known as: BENICAR HCT Take by mouth.   omeprazole 20 MG capsule Commonly known as: PRILOSEC   oxyCODONE 5 MG immediate release tablet Commonly known as: Oxy IR/ROXICODONE Take 5 mg by mouth every 4 (four) hours as needed.   predniSONE 20 MG tablet Commonly known as: DELTASONE   sulfamethoxazole-trimethoprim 800-160 MG tablet  Commonly known as: BACTRIM DS Take 1 tablet by mouth 2 (two) times daily.   Suprep Bowel Prep Kit 17.5-3.13-1.6 GM/177ML Soln Generic drug: Na Sulfate-K Sulfate-Mg Sulf See admin instructions.   Vitamin D 400 units capsule Take by mouth.       Allergies:  Allergies  Allergen Reactions  . Penicillins     REACTION: hives    Family History: No family history on file.  Social History:  reports that she has never smoked. She has never used smokeless tobacco. She reports current alcohol use. She  reports that she does not use drugs.  ROS:                                        Physical Exam: There were no vitals taken for this visit.  Constitutional:  Alert and oriented, No acute distress. HEENT: [place] AT, moist mucus membranes.  Trachea midline, no masses. Cardiovascular: No clubbing, cyanosis, or edema. Respiratory: Normal respiratory effort, no increased work of breathing. GI: Abdomen is soft, nontender, nondistended, no abdominal masses GU: No CVA tenderness.  Mild hypermobility bladder negative cough test.  Minimal grade 1 cystocele Skin: No rashes, bruises or suspicious lesions. Lymph: No cervical or inguinal adenopathy. Neurologic: Grossly intact, no focal deficits, moving all 4 extremities. Psychiatric: Normal mood and affect.  Laboratory Data: Lab Results  Component Value Date   WBC 6.6 05/27/2012   HGB 13.8 05/27/2012   HCT 42.2 05/27/2012   MCV 88 05/27/2012   PLT 244 05/27/2012    Lab Results  Component Value Date   CREATININE 0.76 05/27/2012    No results found for: PSA  No results found for: TESTOSTERONE  No results found for: HGBA1C  Urinalysis No results found for: COLORURINE, APPEARANCEUR, LABSPEC, PHURINE, GLUCOSEU, HGBUR, BILIRUBINUR, KETONESUR, PROTEINUR, UROBILINOGEN, NITRITE, LEUKOCYTESUR  Pertinent Imaging: Reviewed.  Chart reviewed.  Urine sent for culture  Assessment & Plan: Patient may have had an undiagnosed urinary tract infection last week.  She has mild mixed incontinence and frequency worse in the morning.  Gets up twice at night.  The role of medical behavioral therapy discussed.  Reassess in 5 weeks to 6 weeks on Myrbetriq 50 mg samples and prescription.  Consult physical therapy.  If we reach her treatment goal I may stop medication in the future since she might be able to retrain her bladder  1. Urinary incontinence, unspecified type  - Urinalysis, Complete   No follow-ups on file.  Reece Packer, MD  [facility] [address], [place], [place] [address] [phone]

## 2020-08-21 LAB — URINALYSIS, COMPLETE
Bilirubin, UA: NEGATIVE
Glucose, UA: NEGATIVE
Nitrite, UA: NEGATIVE
Protein,UA: NEGATIVE
RBC, UA: NEGATIVE
Specific Gravity, UA: 1.025 (ref 1.005–1.030)
Urobilinogen, Ur: 0.2 mg/dL (ref 0.2–1.0)
pH, UA: 6 (ref 5.0–7.5)

## 2020-08-21 LAB — MICROSCOPIC EXAMINATION: Bacteria, UA: NONE SEEN

## 2020-08-22 DIAGNOSIS — L578 Other skin changes due to chronic exposure to nonionizing radiation: Secondary | ICD-10-CM | POA: Diagnosis not present

## 2020-08-22 DIAGNOSIS — Z85828 Personal history of other malignant neoplasm of skin: Secondary | ICD-10-CM | POA: Diagnosis not present

## 2020-08-23 ENCOUNTER — Telehealth: Payer: Self-pay

## 2020-08-23 LAB — CULTURE, URINE COMPREHENSIVE

## 2020-08-23 NOTE — Telephone Encounter (Signed)
Pt LVM on triage line stating that she is voiding every 58mins x 24hrs. She is having pressure with urination as well as dysuria. Urine cx is negative. Please advise.

## 2020-08-23 NOTE — Telephone Encounter (Signed)
Incoming call from pt on triage line requesting the results of her urine culture. Advised pt that currently urine culture is still in the preliminary stages, she will be contacted with final results. Pt gave verbal understanding.

## 2020-08-25 DIAGNOSIS — N390 Urinary tract infection, site not specified: Secondary | ICD-10-CM | POA: Diagnosis not present

## 2020-08-25 NOTE — Telephone Encounter (Signed)
It looks like I just put the patient on Myrbetriq If she is having lot of frequency and pressure  I recommend to see nurse practitioner in get another urine culture

## 2020-08-27 ENCOUNTER — Ambulatory Visit: Payer: PPO | Admitting: Urology

## 2020-08-27 ENCOUNTER — Other Ambulatory Visit: Payer: Self-pay

## 2020-08-27 ENCOUNTER — Emergency Department
Admission: EM | Admit: 2020-08-27 | Discharge: 2020-08-27 | Disposition: A | Discharge status: Home / Self Care | Payer: PPO | Attending: Emergency Medicine | Admitting: Emergency Medicine

## 2020-08-27 ENCOUNTER — Emergency Department: Payer: PPO

## 2020-08-27 DIAGNOSIS — Z79899 Other long term (current) drug therapy: Secondary | ICD-10-CM | POA: Diagnosis not present

## 2020-08-27 DIAGNOSIS — Z85828 Personal history of other malignant neoplasm of skin: Secondary | ICD-10-CM | POA: Diagnosis not present

## 2020-08-27 DIAGNOSIS — R109 Unspecified abdominal pain: Secondary | ICD-10-CM | POA: Diagnosis not present

## 2020-08-27 DIAGNOSIS — R1030 Lower abdominal pain, unspecified: Secondary | ICD-10-CM | POA: Diagnosis not present

## 2020-08-27 DIAGNOSIS — I10 Essential (primary) hypertension: Secondary | ICD-10-CM | POA: Insufficient documentation

## 2020-08-27 DIAGNOSIS — N9489 Other specified conditions associated with female genital organs and menstrual cycle: Secondary | ICD-10-CM | POA: Diagnosis not present

## 2020-08-27 DIAGNOSIS — R350 Frequency of micturition: Secondary | ICD-10-CM | POA: Diagnosis not present

## 2020-08-27 DIAGNOSIS — R1031 Right lower quadrant pain: Secondary | ICD-10-CM | POA: Diagnosis not present

## 2020-08-27 LAB — URINALYSIS, COMPLETE (UACMP) WITH MICROSCOPIC
Bacteria, UA: NONE SEEN
Bilirubin Urine: NEGATIVE
Glucose, UA: NEGATIVE mg/dL
Hgb urine dipstick: NEGATIVE
Ketones, ur: NEGATIVE mg/dL
Nitrite: NEGATIVE
Protein, ur: NEGATIVE mg/dL
Specific Gravity, Urine: 1.003 — ABNORMAL LOW (ref 1.005–1.030)
Squamous Epithelial / HPF: NONE SEEN (ref 0–5)
pH: 6 (ref 5.0–8.0)

## 2020-08-27 LAB — COMPREHENSIVE METABOLIC PANEL
ALT: 13 U/L (ref 0–44)
AST: 19 U/L (ref 15–41)
Albumin: 4.6 g/dL (ref 3.5–5.0)
Alkaline Phosphatase: 60 U/L (ref 38–126)
Anion gap: 9 (ref 5–15)
BUN: 17 mg/dL (ref 8–23)
CO2: 27 mmol/L (ref 22–32)
Calcium: 9.1 mg/dL (ref 8.9–10.3)
Chloride: 102 mmol/L (ref 98–111)
Creatinine, Ser: 0.68 mg/dL (ref 0.44–1.00)
GFR, Estimated: >60 mL/min (ref >60)
Glucose, Bld: 94 mg/dL (ref 70–99)
Potassium: 3.9 mmol/L (ref 3.5–5.1)
Sodium: 138 mmol/L (ref 135–145)
Total Bilirubin: 0.8 mg/dL (ref 0.3–1.2)
Total Protein: 7.1 g/dL (ref 6.5–8.1)

## 2020-08-27 LAB — CBC
HCT: 42.2 % (ref 36.0–46.0)
Hemoglobin: 14.1 g/dL (ref 12.0–15.0)
MCH: 29.6 pg (ref 26.0–34.0)
MCHC: 33.4 g/dL (ref 30.0–36.0)
MCV: 88.5 fL (ref 80.0–100.0)
Platelets: 214 10*3/uL (ref 150–400)
RBC: 4.77 MIL/uL (ref 3.87–5.11)
RDW: 12.8 % (ref 11.5–15.5)
WBC: 6.8 10*3/uL (ref 4.0–10.5)
nRBC: 0 % (ref 0.0–0.2)

## 2020-08-27 MED ORDER — IOHEXOL 300 MG/ML  SOLN
75.0000 mL | Freq: Once | INTRAMUSCULAR | Status: AC | PRN
  Administered 2020-08-27: 75 mL via INTRAVENOUS

## 2020-08-27 NOTE — ED Triage Notes (Signed)
Pt in with co lower abd pressure and urinary frequency. States urinary frequency, went to urgent care and was told she just had WBC in urine. Was sent here for further workup. Pt was also concerned about htn at office, denies any other symptoms.

## 2020-08-27 NOTE — ED Provider Notes (Signed)
[facility] Emergency Department Provider Note  Time seen: 5:21 PM  I have reviewed the triage vital signs and the nursing notes.   HISTORY  Chief Complaint Hypertension and Urinary Frequency   HPI Shelley Sims is a 75 y.o. female with a past medical history of hypertension, presents to the emergency department for lower abdominal/pelvic discomfort.  According to the patient over the past 2 weeks she has been experiencing some more abdominal discomfort and urinary frequency.  Patient saw her doctor on 08/13/2020 for the same essentially had a negative work-up including a negative urine culture.  Patient states she has since went to an urgent care several days ago and they noted some white blood cells in the urine and prescribe her nitrofurantoin.  Patient states she continued to have discomfort in lower abdomen today and was told if she did not feel any better after taking the medication for a couple days she should go to the emergency department for evaluation.  Patient denies any fever nausea vomiting or diarrhea.  States urinary frequency but denies dysuria.  States lower abdominal pressure states is fairly mild.   Past Medical History:  Diagnosis Date  . Atypical mole 09/03/2017   right low back 4.0cm lat to spine  . History of basal cell carcinoma (BCC) 02/27/2020   right nasal sidewall  . Hypertension     Patient Active Problem List   Diagnosis Date Noted  . Encounter for screening colonoscopy   . Polyp of transverse colon   . Gastroesophageal reflux disease   . Gastritis without bleeding   . Stiffness of finger joint of left hand 09/22/2019  . Encounter for orthopedic follow-up care 09/08/2019  . Pain of left hand 06/02/2019  . Trigger thumb of left hand 03/15/2019  . Hypertension 03/07/2019  . Menopause 03/07/2019  . Osteopenia 03/07/2019  . Acute pain of right knee 10/12/2018  . Rotator cuff tear, left 11/17/2016  . Pain of right sacroiliac joint  11/22/2014  . Abnormality of gait 11/22/2014  . Plantar fibromatosis 11/22/2014    Past Surgical History:  Procedure Laterality Date  . COLONOSCOPY    . COLONOSCOPY WITH PROPOFOL N/A 10/18/2019   Procedure: COLONOSCOPY WITH PROPOFOL;  Surgeon: [name], MD;  Location: [facility] ENDOSCOPY;  Service: Endoscopy;  Laterality: N/A;  . ESOPHAGOGASTRODUODENOSCOPY (EGD) WITH PROPOFOL N/A 10/18/2019   Procedure: ESOPHAGOGASTRODUODENOSCOPY (EGD) WITH PROPOFOL;  Surgeon: [name], MD;  Location: [facility] ENDOSCOPY;  Service: Endoscopy;  Laterality: N/A;  . FOOT SURGERY      Prior to Admission medications   Medication Sig Start Date End Date Taking? Authorizing Provider  ALPRAZolam (XANAX) 0.5 MG tablet TAKE ONE-HALF TO ONE TABLET EVERY EIGHT HOURS AS NEEDED 07/13/14   [provider]  azelastine (ASTELIN) 0.1 % nasal spray  01/06/19   [provider]  Calcium Carbonate-Vitamin D 600-200 MG-UNIT TABS Take by mouth.    [provider]  cyclobenzaprine (FLEXERIL) 5 MG tablet Take 1 tablet (5 mg total) by mouth 3 (three) times daily as needed for muscle spasms. 01/25/14   Pick-Jacobs, John C, DO  Influenza Vac High-Dose Quad (FLUZONE HIGH-DOSE QUADRIVALENT) 0.7 ML SUSY Fluzone High-Dose Quad 2020-21 (PF) 240 mcg/0.7 mL IM syringe    [provider]  mirabegron ER (MYRBETRIQ) 50 MG TB24 tablet Take 1 tablet (50 mg total) by mouth daily. 08/20/20   Bjorn Loser, MD  mometasone (ELOCON) 0.1 % cream Apply to scalp qd-bid prn 09/21/19   Ralene Bathe, MD  nitroGLYCERIN (NITRODUR - DOSED IN MG/24 HR) 0.2 mg/hr patch USE 1/4 PATCH FOR 24 HOURS TO THE AFFECTED AREA 10/12/18   Stefanie Libel, MD  olmesartan (BENICAR) 20 MG tablet Take 20 mg by mouth daily. 07/11/20   [provider]  omeprazole (PRILOSEC) 20 MG capsule  01/06/19   [provider]  oxyCODONE (OXY IR/ROXICODONE) 5 MG immediate release tablet Take 5 mg by mouth every 4 (four) hours as needed. 09/08/19    [provider]    Allergies  Allergen Reactions  . Penicillins     REACTION: hives    No family history on file.  Social History Social History   Tobacco Use  . Smoking status: Never Smoker  . Smokeless tobacco: Never Used  Vaping Use  . Vaping Use: Never used  Substance Use Topics  . Alcohol use: Yes  . Drug use: Never    Review of Systems Constitutional: Negative for fever. Cardiovascular: Negative for chest pain. Respiratory: Negative for shortness of breath. Gastrointestinal: Mild lower abdominal pressure.  Negative for nausea vomiting or diarrhea Genitourinary: Positive urinary frequency without dysuria Musculoskeletal: Negative for musculoskeletal complaints Neurological: Negative for headache All other ROS negative  ____________________________________________   PHYSICAL EXAM:  VITAL SIGNS: ED Triage Vitals [08/27/20 1706]  Enc Vitals Group     BP (!) 161/100     Pulse Rate 71     Resp 20     Temp 98 F (36.7 C)     Temp Source Oral     SpO2 100 %     Weight 145 lb (65.8 kg)     Height 5\' 7"  (1.702 m)     Head Circumference      Peak Flow      Pain Score      Pain Loc      Pain Edu?      Excl. in [place]?    Constitutional: Alert and oriented. Well appearing and in no distress. Eyes: Normal exam ENT      Head: Normocephalic and atraumatic.      Mouth/Throat: Mucous membranes are moist. Cardiovascular: Normal rate, regular rhythm.  Respiratory: Normal respiratory effort without tachypnea nor retractions. Breath sounds are clear Gastrointestinal: Soft, mild suprapubic tenderness palpation without rebound guarding or distention. Musculoskeletal: Nontender with normal range of motion in all extremities.  Neurologic:  Normal speech and language. No gross focal neurologic deficits Skin:  Skin is warm, dry and intact.  Psychiatric: Mood and affect are normal.    RADIOLOGY  No acute finding on CT imaging.  Does show enlarged gonadal veins  possibly representing pelvic congestion.  ____________________________________________   INITIAL IMPRESSION / ASSESSMENT AND PLAN / ED COURSE  Pertinent labs & imaging results that were available during my care of the patient were reviewed by me and considered in my medical decision making (see chart for details).   Patient presents emergency department for lower abdominal discomfort x2 weeks along with urinary frequency.  Reviewed the patient's chart and care everywhere negative urine culture 08/13/2020.  Given the patient's complaints we will check labs including urinalysis and urine culture.  Given 2 weeks of lower abdominal pain despite treatment with antibiotics and a negative urine culture I believe the patient benefit from CT imaging abdomen/pelvis to rule out intra-abdominal pathology such as diverticulitis, colitis, appendicitis.  Patient agreeable plan of care.  Work-up is essentially negative including lab work and urinalysis.  CT scan does show possible pelvic congestion which could be the cause of  the patient's discomfort.  Patient has a follow-up appointment with gynecology on Thursday.  Overall patient reassured by work-up.  We will discharge patient home. ARYANNE GILLELAND was evaluated in Emergency Department on 08/27/2020 for the symptoms described in the history of present illness. She was evaluated in the context of the global COVID-19 pandemic, which necessitated consideration that the patient might be at risk for infection with the SARS-CoV-2 virus that causes COVID-19. Institutional protocols and algorithms that pertain to the evaluation of patients at risk for COVID-19 are in a state of rapid change based on information released by regulatory bodies including the CDC and federal and state organizations. These policies and algorithms were followed during the patient's care in the ED.  ____________________________________________   FINAL CLINICAL IMPRESSION(S) / ED DIAGNOSES  Lower  abdominal discomfort   Harvest Dark, MD 08/27/20 1900

## 2020-08-29 DIAGNOSIS — N9489 Other specified conditions associated with female genital organs and menstrual cycle: Secondary | ICD-10-CM | POA: Diagnosis not present

## 2020-08-29 DIAGNOSIS — R350 Frequency of micturition: Secondary | ICD-10-CM | POA: Diagnosis not present

## 2020-08-29 DIAGNOSIS — I10 Essential (primary) hypertension: Secondary | ICD-10-CM | POA: Diagnosis not present

## 2020-08-29 LAB — URINE CULTURE

## 2020-08-29 NOTE — Progress Notes (Signed)
[facility] New Patient Evaluation and Consultation  Referring Provider: Maryland Pink, MD PCP: Maryland Pink, MD Date of Service: 08/30/2020  SUBJECTIVE Chief Complaint: New Patient (Initial Visit) (Self referral, incontinence)  History of Present Illness: Shelley Sims is a 75 y.o. White or Caucasian female presenting for evaluation of frequent urination and pelvic pain.    Review of records in Epic significant for: Seen by Dr Lorra Hals and was started on Myrbetriq 50mg  daily for mixed incontinence.  Went to ED for pelvic pain and had a CT A/P performed: IMPRESSION: 1. No acute intra-abdominal or pelvic pathology. 2. Small scattered colonic diverticula. No bowel obstruction. Normal appendix. 3. Mildly enlarged gonadal veins may represent pelvic congestion. Clinical correlation is recommended. 4. Aortic Atherosclerosis (ICD10-I70.0).  Urinary Symptoms: Leaks urine with cough/ sneeze, with a full bladder, with urgency and while asleep Leaks 1 time(s) per day. Leakage is less of an issue than the frequency she is having.  Pad use: none She is bothered by her UI symptoms.  Day time voids 15.  Nocturia: 3-4 times per night to void. Increased frequency in the last few weeks.  Voiding dysfunction: she does not empty her bladder well.  does not use a catheter to empty bladder.  When urinating, she feels the need to urinate multiple times in a row and to push on her belly or vagina to empty bladder Drinks: 1.5 cups decaf coffee, 40 oz water per day Started myrbetriq but then was concerned about high blood pressure so stopped it.   UTIs: 0 UTI's in the last year.   Denies history of blood in urine and kidney or bladder stones  Pelvic Organ Prolapse Symptoms:                  She Denies a feeling of a bulge the vaginal area.   Bowel Symptom: Bowel movements: 1-2 time(s) per day Stool consistency: hard or soft  Straining: no.  Splinting: no.  Incomplete  evacuation: no.  She Denies accidental bowel leakage / fecal incontinence Bowel regimen: none Last colonoscopy: Date 08/2019, Results one polyp  Sexual Function Sexually active: no.  Husband had prostate cancer.   Pelvic Pain Admits to pelvic pain- fullness, has tenderness where the bladder is.  Location: fullness  Past Medical History:  Past Medical History:  Diagnosis Date  . Atypical mole 09/03/2017   right low back 4.0cm lat to spine  . History of basal cell carcinoma (BCC) 02/27/2020   right nasal sidewall  . Hypertension      Past Surgical History:   Past Surgical History:  Procedure Laterality Date  . COLONOSCOPY    . COLONOSCOPY WITH PROPOFOL N/A 10/18/2019   Procedure: COLONOSCOPY WITH PROPOFOL;  Surgeon: [name], MD;  Location: [facility] ENDOSCOPY;  Service: Endoscopy;  Laterality: N/A;  . ESOPHAGOGASTRODUODENOSCOPY (EGD) WITH PROPOFOL N/A 10/18/2019   Procedure: ESOPHAGOGASTRODUODENOSCOPY (EGD) WITH PROPOFOL;  Surgeon: [name], MD;  Location: [facility] ENDOSCOPY;  Service: Endoscopy;  Laterality: N/A;  . FOOT SURGERY    . HAND SURGERY       Past OB/GYN History: OB History  Gravida Para Term Preterm AB Living  2 2          SAB IAB Ectopic Multiple Live Births               # Outcome Date GA Lbr Len/2nd Weight Sex Delivery Anes PTL Lv  2 Para           1 Para  SVD x2 Menopausal: Yes, at age 36, Denies vaginal bleeding since menopause Last pap smear was many years ago.  Any history of abnormal pap smears: yes- many years ago when she had    Medications: She has a current medication list which includes the following prescription(s): alprazolam, calcium carbonate-vitamin d, mirabegron er, nitroglycerin, olmesartan, omeprazole, turmeric, zinc sulfate, mirabegron er, and mometasone.   Allergies: Patient is allergic to penicillins.   Social History:  Social History   Tobacco Use  . Smoking status: Never Smoker  . Smokeless tobacco: Never Used   Vaping Use  . Vaping Use: Never used  Substance Use Topics  . Alcohol use: Yes  . Drug use: Never    Relationship status: married She lives with husband.   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 Regular exercise: Yes: walking and pilates, and tennis. Does not leak with exercise.   Family History:  History reviewed. No pertinent family history.   Review of Systems: Review of Systems  Constitutional: Negative for fever, malaise/fatigue and weight loss.  Respiratory: Negative for cough, shortness of breath and wheezing.   Cardiovascular: Negative for chest pain, palpitations and leg swelling.  Gastrointestinal: Negative for abdominal pain and blood in stool.  Genitourinary: Positive for dysuria.  Musculoskeletal: Negative for myalgias.  Skin: Negative for rash.  Neurological: Negative for dizziness and headaches.  Endo/Heme/Allergies: Does not bruise/bleed easily.       + hot flashes  Psychiatric/Behavioral: Negative for depression. The patient is not nervous/anxious.      OBJECTIVE Physical Exam: Vitals:   08/30/20 0837  BP: (!) 141/74  Pulse: 65  Weight: 145 lb (65.8 kg)  Height: 5\' 7"  (1.702 m)    Physical Exam Constitutional:      General: She is not in acute distress. Pulmonary:     Effort: Pulmonary effort is normal.  Abdominal:     General: There is no distension.     Palpations: Abdomen is soft.     Tenderness: There is no abdominal tenderness. There is no rebound.  Musculoskeletal:        General: No swelling. Normal range of motion.  Skin:    General: Skin is warm and dry.     Findings: No rash.  Neurological:     Mental Status: She is alert and oriented to person, place, and time.  Psychiatric:        Mood and Affect: Mood normal.        Behavior: Behavior normal.      GU / Detailed Urogynecologic Evaluation:  Pelvic Exam: Normal external female genitalia; Bartholin's and Skene's glands normal in appearance; urethral meatus normal in  appearance, no urethral masses or discharge.   CST: negative  Speculum exam reveals normal vaginal mucosa with atrophy. Cervix normal appearance. Uterus normal single, nontender. Adnexa no mass, fullness, tenderness.     Pelvic floor strength I/V  Pelvic floor musculature: Right levator non-tender, Right obturator tender, Left levator tender, Left obturator tender  POP-Q:   POP-Q  -2                                            Aa   -2  Ba  -6                                              C   2.5                                            Gh  3                                            Pb  8.5                                            tvl   -3                                            Ap  -3                                            Bp  -8                                              D     Rectal Exam:  Normal external rectum  Post-Void Residual (PVR) by Bladder Scan: In order to evaluate bladder emptying, we discussed obtaining a postvoid residual and she agreed to this procedure.  Procedure: The ultrasound unit was placed on the patient's abdomen in the suprapubic region after the patient had voided. A PVR of 94 ml was obtained by bladder scan.  Laboratory Results: POC urine: negative  I visualized the urine specimen, noting the specimen to be clear yellow  ASSESSMENT AND PLAN Shelley Sims is a 75 y.o. with:  1. Overactive bladder   2. Urinary frequency   3. Levator spasm   4. SUI (stress urinary incontinence, female)    1. OAB We discussed the symptoms of overactive bladder (OAB), which include urinary urgency, urinary frequency, nocturia, with or without urge incontinence.  While we do not know the exact etiology of OAB, several treatment options exist. We discussed management including behavioral therapy (decreasing bladder irritants, urge suppression strategies, timed voids, bladder retraining), physical therapy,  medication.  For anticholinergic medications, we discussed the potential side effects of anticholinergics including dry eyes, dry mouth, constipation, cognitive impairment and urinary retention. For Beta-3 agonist medication, we discussed the potential side effect of elevated blood pressure which is more likely to occur in individuals with uncontrolled hypertension. - She is interested in continuing with medication as the Myrbetriq 50mg  helped with her symptoms. I discussed that her BP is in an acceptable range today to continue the medication. She should restart and then monitor her BP at home. If it becomes elevated, then  we can discuss alternative options. Refill provided.  - She is also interested in pelvic floor physical therapy- referral placed to [facility] rehab.  - She will work on retraining her bladder with increasing time to bathroom.   2. Levator spasm The origin of pelvic floor muscle spasm can be multifactorial, including primary, reactive to a different pain source, trauma, or even part of a centralized pain syndrome.Treatment options include pelvic floor physical therapy, local (vaginal) or oral  muscle relaxants, pelvic muscle trigger point injections or centrally acting pain medications.   - She is interested in starting with pelvic floor physical therapy. She also felt that ibuprofen helped so she will continue with that at home.   3. SUI - Not as bothersome/ frequent as other symptoms so did not address specific treatments today. Can readdress if symptoms worsen.  Return about 4 weeks for follow up   [name], MD   Time spent: I spent 60 minutes dedicated to the care of this patient on the date of this encounter to include pre-visit review of records, face-to-face time with the patient and post visit documentation and ordering medication/ testing.

## 2020-08-30 ENCOUNTER — Encounter: Payer: Self-pay | Admitting: Obstetrics and Gynecology

## 2020-08-30 ENCOUNTER — Ambulatory Visit (INDEPENDENT_AMBULATORY_CARE_PROVIDER_SITE_OTHER): Payer: PPO | Admitting: Obstetrics and Gynecology

## 2020-08-30 ENCOUNTER — Other Ambulatory Visit: Payer: Self-pay

## 2020-08-30 VITALS — BP 141/74 | HR 65 | Ht 67.0 in | Wt 145.0 lb

## 2020-08-30 DIAGNOSIS — N3281 Overactive bladder: Secondary | ICD-10-CM

## 2020-08-30 DIAGNOSIS — R350 Frequency of micturition: Secondary | ICD-10-CM

## 2020-08-30 DIAGNOSIS — N393 Stress incontinence (female) (male): Secondary | ICD-10-CM

## 2020-08-30 DIAGNOSIS — M62838 Other muscle spasm: Secondary | ICD-10-CM | POA: Diagnosis not present

## 2020-08-30 LAB — POCT URINALYSIS DIPSTICK
Appearance: NORMAL
Bilirubin, UA: NEGATIVE
Blood, UA: NEGATIVE
Glucose, UA: NEGATIVE
Ketones, UA: NEGATIVE
Leukocytes, UA: NEGATIVE
Nitrite, UA: NEGATIVE
Protein, UA: NEGATIVE
Spec Grav, UA: 1.01 (ref 1.010–1.025)
Urobilinogen, UA: 0.2 E.U./dL
pH, UA: 6 (ref 5.0–8.0)

## 2020-08-30 MED ORDER — MIRABEGRON ER 50 MG PO TB24: 50.0000 mg | ORAL_TABLET | Freq: Every day | ORAL | 5 refills | Status: DC

## 2020-08-30 NOTE — Patient Instructions (Signed)
We discussed the symptoms of overactive bladder (OAB), which include urinary urgency, urinary frequency, night-time urination, with or without urge incontinence.  We discussed management including behavioral therapy (decreasing bladder irritants by following a bladder diet, urge suppression strategies, timed voids, bladder retraining), physical therapy, medication; and for refractory cases posterior tibial nerve stimulation, sacral neuromodulation, and intravesical botulinum toxin injection.   For Beta-3 agonist medication, we discussed the potential side effect of elevated blood pressure which is more likely to occur in individuals with uncontrolled hypertension. You were given for Myrbetriq 50 mg.  It can take a month to start working so give it time, but if you have bothersome side effects call sooner and we can try a different medication.  Call us if you have trouble filling the prescription or if it's not covered by your insurance.

## 2020-09-03 ENCOUNTER — Encounter: Payer: PPO | Admitting: Dermatology

## 2020-09-05 ENCOUNTER — Encounter: Payer: Self-pay | Admitting: Obstetrics and Gynecology

## 2020-09-05 ENCOUNTER — Telehealth: Payer: Self-pay | Admitting: Obstetrics and Gynecology

## 2020-09-05 NOTE — Telephone Encounter (Signed)
Started taking the Myrbetriq last Wednesday. Had her COVID booster shot on Friday then developed a rash on her legs yesterday. She had previously taken Myrbetriq from another prescriber previously and did not have any issues. We discussed that since she has had several exposures to the medication previously and did not have any issues, then its more likely that its due to the booster shot. She will call if she decides to change medications and we can prescribe something different. She is overall happy with how the medication has been helping her so would prefer not to change.

## 2020-09-13 ENCOUNTER — Other Ambulatory Visit: Payer: Self-pay

## 2020-09-13 ENCOUNTER — Ambulatory Visit: Payer: PPO | Admitting: Dermatology

## 2020-09-13 DIAGNOSIS — L237 Allergic contact dermatitis due to plants, except food: Secondary | ICD-10-CM

## 2020-09-13 MED ORDER — CLOBETASOL PROPIONATE 0.05 % EX CREA
TOPICAL_CREAM | CUTANEOUS | 1 refills | Status: AC
Start: 2020-09-13 — End: ?

## 2020-09-13 MED ORDER — CLOBETASOL PROP EMOLLIENT BASE 0.05 % EX CREA: TOPICAL_CREAM | CUTANEOUS | 1 refills | Status: AC

## 2020-09-13 MED ORDER — PREDNISONE 20 MG PO TABS: 20.0000 mg | ORAL_TABLET | Freq: Every day | ORAL | 0 refills | Status: DC

## 2020-09-13 NOTE — Progress Notes (Signed)
   Follow-Up Visit   Subjective  Shelley Sims is a 75 y.o. female who presents for the following: Rash (Check a itchy rash on her hands x 10 days, pt had a covid injection 2 weeks ago ). Pt will be leaving for [place] in 5 days.   The following portions of the chart were reviewed this encounter and updated as appropriate:   Tobacco  Allergies  Meds  Problems  Med Hx  Surg Hx  Fam Hx     Review of Systems:  No other skin or systemic complaints except as noted in HPI or Assessment and Plan.  Objective  Well appearing patient in no apparent distress; mood and affect are within normal limits.  A focused examination was performed including face, hands . Relevant physical exam findings are noted in the Assessment and Plan.  Objective  palm of hands: Edematous bumpy pink papule    Assessment & Plan  Plant allergic contact dermatitis palm of hands Suspect poison ivy- Contact dermatitis  Start Clobetasol cream apply to hands bid x 2 weeks  Start otc Allegra 180 mg daily for 1 week   Since patient will be traveling in 5 days we will prescribe  Prednisone 20 mg 2 a day a 3 days, 1 a day x 3 and 1 qod x 3 days #12  He may take it now or wait to see if she needs it.  She should take it on her trip with her in case she has further problems on her trip.  predniSONE (DELTASONE) 20 MG tablet - palm of hands  Ordered Medications: Clobetasol Prop Emollient Base (CLOBETASOL PROPIONATE E) 0.05 % emollient cream  Other Related Medications clobetasol cream (TEMOVATE) 0.05 %  Return if symptoms worsen or fail to improve.  IMarye Round, CMA, am acting as scribe for Sarina Ser, MD .  Documentation: I have reviewed the above documentation for accuracy and completeness, and I agree with the above.  Sarina Ser, MD

## 2020-09-13 NOTE — Patient Instructions (Addendum)
  If you have any questions or concerns for your doctor, please call our main line at [phone] and press option 4 to reach your doctor's medical assistant. If no one answers, please leave a voicemail as directed and we will return your call as soon as possible. Messages left after 4 pm will be answered the following business day.   You may also send [place] a message via [place]. We typically respond to MyChart messages within 1-2 business days.  For prescription refills, please ask your pharmacy to contact our office. Our fax number is [phone].  If you have an urgent issue when the clinic is closed that cannot wait until the next business day, you can page your doctor at the number below.    Please note that while we do our best to be available for urgent issues outside of office hours, we are not available 24/7.   If you have an urgent issue and are unable to reach [place], you may choose to seek medical care at your doctor's office, retail clinic, urgent care center, or emergency room.  If you have a medical emergency, please immediately call 911 or go to the emergency department.  Pager Numbers  - Dr. Nehemiah Massed: [phone]  - Dr. Laurence Ferrari: [phone]  - Dr. Nicole Kindred: [phone]  In the event of inclement weather, please call our main line at [phone] for an update on the status of any delays or closures.  Dermatology Medication Tips: Please keep the boxes that topical medications come in in order to help keep track of the instructions about where and how to use these. Pharmacies typically print the medication instructions only on the boxes and not directly on the medication tubes.   If your medication is too expensive, please contact our office at [phone] option 4 or send [place] a message through [place].   We are unable to tell what your co-pay for medications will be in advance as this is different depending on your insurance coverage. However, we may be able to find a  substitute medication at lower cost or fill out paperwork to get insurance to cover a needed medication.   If a prior authorization is required to get your medication covered by your insurance company, please allow [place] 1-2 business days to complete this process.  Drug prices often vary depending on where the prescription is filled and some pharmacies may offer cheaper prices.  The website www.goodrx.com contains coupons for medications through different pharmacies. The prices here do not account for what the cost may be with help from insurance (it may be cheaper with your insurance), but the website can give you the price if you did not use any insurance.  - You can print the associated coupon and take it with your prescription to the pharmacy.  - You may also stop by our office during regular business hours and pick up a GoodRx coupon card.  - If you need your prescription sent electronically to a different pharmacy, notify our office through [facility] or by phone at [phone] option 4.   Start over the counter Allegra 180 mg 1 tablet daily for 7 days

## 2020-09-14 ENCOUNTER — Ambulatory Visit: Payer: PPO | Admitting: Obstetrics and Gynecology

## 2020-09-16 ENCOUNTER — Encounter: Payer: Self-pay | Admitting: Dermatology

## 2020-10-01 ENCOUNTER — Ambulatory Visit: Payer: Self-pay | Admitting: Urology

## 2020-10-01 ENCOUNTER — Ambulatory Visit: Payer: PPO | Attending: Obstetrics and Gynecology

## 2020-10-02 ENCOUNTER — Ambulatory Visit: Payer: PPO | Admitting: Obstetrics and Gynecology

## 2020-10-02 ENCOUNTER — Other Ambulatory Visit: Payer: PPO | Admitting: Sports Medicine

## 2020-10-03 DIAGNOSIS — J0100 Acute maxillary sinusitis, unspecified: Secondary | ICD-10-CM | POA: Diagnosis not present

## 2020-10-03 DIAGNOSIS — U071 COVID-19: Secondary | ICD-10-CM | POA: Diagnosis not present

## 2020-10-12 ENCOUNTER — Ambulatory Visit: Payer: PPO | Admitting: Obstetrics and Gynecology

## 2020-11-14 ENCOUNTER — Ambulatory Visit: Payer: PPO | Admitting: Sports Medicine

## 2020-11-14 ENCOUNTER — Other Ambulatory Visit: Payer: Self-pay

## 2020-11-14 DIAGNOSIS — M19112 Post-traumatic osteoarthritis, left shoulder: Secondary | ICD-10-CM | POA: Diagnosis not present

## 2020-11-14 NOTE — Progress Notes (Signed)
Chief complaint chronic left shoulder pain  Patient has played tennis since age 75 For several years she has had chronic shoulder pain Now worse with forehand strokes, certain volley strokes and serve X-ray in March 2021 showed loss of cartilage that was severe between the acetabulum and the humeral head There is a small spur Humeral head itself is not completely round  She has continued to stay active without severe pain She does get pain if she plays too much or hits the wrong strokes  Review of systems Denies neck pain Denies radiculopathy  Physical exam Athletic older female in no acute distress BP 110/72   Ht 5' 7.5" (1.715 m)   Wt 145 lb (65.8 kg)   BMI 22.38 kg/m  No flowsheet data found.  Left shoulder shows almost full range of motion Mild limitation on internal rotation and on full elevation Rotator cuff strength testing is intact Internal rotation of the shoulder is painful at the anterior joint line Biceps testing is nonpainful  X-ray reviewed with findings as above

## 2020-11-14 NOTE — Assessment & Plan Note (Signed)
With her rotator cuff now strong I think she can continue to be active.  She will need to modify her tennis strokes not to cross her body which is where she gets impingement  She will need to do range of motion exercises Try topical Voltaren Do not do any strength exercises that stress internal rotation of the shoulder  Recheck with me pending her response

## 2020-11-14 NOTE — Patient Instructions (Signed)
It was great to see you today!  -For your shoulder pain we discussed the following: -You have arthritis that is mostly on the inside of your shoulder. -You can try topical voltaren gel on the front of your shoulder. -Nitro patches if you find them helpful -Work on your tennis stroke but don't come across the front of your body. -No top spin or kick serves -Change position on your exercises so it doesn't cause you pain (avoiding anything coming across your body)  Please call us with any questions!

## 2020-11-15 DIAGNOSIS — D485 Neoplasm of uncertain behavior of skin: Secondary | ICD-10-CM | POA: Diagnosis not present

## 2020-12-04 DIAGNOSIS — C44111 Basal cell carcinoma of skin of unspecified eyelid, including canthus: Secondary | ICD-10-CM | POA: Insufficient documentation

## 2020-12-10 ENCOUNTER — Ambulatory Visit (INDEPENDENT_AMBULATORY_CARE_PROVIDER_SITE_OTHER): Payer: PPO | Admitting: Obstetrics and Gynecology

## 2020-12-10 ENCOUNTER — Other Ambulatory Visit: Payer: Self-pay

## 2020-12-10 ENCOUNTER — Encounter: Payer: Self-pay | Admitting: Obstetrics and Gynecology

## 2020-12-10 VITALS — BP 136/77 | HR 68

## 2020-12-10 DIAGNOSIS — N3281 Overactive bladder: Secondary | ICD-10-CM

## 2020-12-10 NOTE — Progress Notes (Signed)
Shelley Sims  SUBJECTIVE  History of Present Illness: Shelley Sims is a 75 y.o. female seen in follow-up for OAB and levator spasm. Plan at last Sims was to start Myrbetriq and referral was placed to pelvic floor PT.   Medication worked really well while she was traveling then she stopped it when she returned. She has stopped drinking so much coffee and is drinking more water.   Felt something at the vagina and is unsure if she has prolapse developing. Has not been to physical therapy because she had covid while traveling.   Past Medical History: Patient  has a past medical history of Atypical mole (09/03/2017), History of basal cell carcinoma (BCC) (02/27/2020), and Hypertension.   Past Surgical History: She  has a past surgical history that includes Foot surgery; Colonoscopy; Colonoscopy with propofol (N/A, 10/18/2019); Esophagogastroduodenoscopy (egd) with propofol (N/A, 10/18/2019); and Hand surgery.   Medications: She has a current medication list which includes the following prescription(s): alprazolam, calcium carbonate-vitamin d, clobetasol cream, clobetasol prop emollient base, mometasone, nitroglycerin, olmesartan, omeprazole, turmeric, and zinc sulfate.   Allergies: Patient is allergic to penicillins.   Social History: Patient  reports that she has never smoked. She has never used smokeless tobacco. She reports current alcohol use. She reports that she does not use drugs.      OBJECTIVE     Physical Exam: Vitals:   12/10/20 1512  BP: 136/77  Pulse: 68   Gen: No apparent distress, A&O x 3.  Detailed Urogynecologic Evaluation:  POP-Q  -2                                            Aa   -2                                           Ba  -6.5                                              C   2                                            Gh  4                                            Pb  8.5                                             tvl   -3                                            Ap  -3  Bp  -8                                              D      ASSESSMENT AND PLAN    Shelley Sims is a 74 y.o. with:  1. Overactive bladder   - Symptoms have improved after decreasing caffeine. No longer taking Myrbetriq at this time.  - No significant prolapse noted on exam.  - Encouraged to follow up with PT.   Return as needed.   [name], MD  Time spent: I spent 25 minutes dedicated to the care of this patient on the date of this encounter to include pre-Sims review of records, face-to-face time with the patient and post Sims documentation.

## 2020-12-24 ENCOUNTER — Encounter: Payer: PPO | Admitting: Dermatology

## 2020-12-26 ENCOUNTER — Other Ambulatory Visit: Payer: Self-pay

## 2020-12-26 ENCOUNTER — Ambulatory Visit: Payer: PPO | Admitting: Dermatology

## 2020-12-26 ENCOUNTER — Encounter: Payer: Self-pay | Admitting: Dermatology

## 2020-12-26 DIAGNOSIS — L814 Other melanin hyperpigmentation: Secondary | ICD-10-CM | POA: Diagnosis not present

## 2020-12-26 DIAGNOSIS — L578 Other skin changes due to chronic exposure to nonionizing radiation: Secondary | ICD-10-CM

## 2020-12-26 DIAGNOSIS — D239 Other benign neoplasm of skin, unspecified: Secondary | ICD-10-CM

## 2020-12-26 DIAGNOSIS — I781 Nevus, non-neoplastic: Secondary | ICD-10-CM

## 2020-12-26 DIAGNOSIS — D229 Melanocytic nevi, unspecified: Secondary | ICD-10-CM | POA: Diagnosis not present

## 2020-12-26 DIAGNOSIS — D2371 Other benign neoplasm of skin of right lower limb, including hip: Secondary | ICD-10-CM | POA: Diagnosis not present

## 2020-12-26 DIAGNOSIS — D1800 Hemangioma unspecified site: Secondary | ICD-10-CM

## 2020-12-26 DIAGNOSIS — D692 Other nonthrombocytopenic purpura: Secondary | ICD-10-CM | POA: Diagnosis not present

## 2020-12-26 DIAGNOSIS — Z85828 Personal history of other malignant neoplasm of skin: Secondary | ICD-10-CM | POA: Diagnosis not present

## 2020-12-26 DIAGNOSIS — L821 Other seborrheic keratosis: Secondary | ICD-10-CM | POA: Diagnosis not present

## 2020-12-26 DIAGNOSIS — Z1283 Encounter for screening for malignant neoplasm of skin: Secondary | ICD-10-CM | POA: Diagnosis not present

## 2020-12-26 DIAGNOSIS — C441192 Basal cell carcinoma of skin of left lower eyelid, including canthus: Secondary | ICD-10-CM

## 2020-12-26 NOTE — Progress Notes (Signed)
Follow-Up Visit   Subjective  Shelley Sims is a 75 y.o. female who presents for the following: Total body skin exam (Hx of BCC R nasal sidewall) and BCC bx proven (Left eye, bx by [facility], and pt is scheduled with Dr. Manley Mason for exc). The patient presents for Total-Body Skin Exam (TBSE) for skin cancer screening and mole check.  The following portions of the chart were reviewed this encounter and updated as appropriate:   Tobacco  Allergies  Meds  Problems  Med Hx  Surg Hx  Fam Hx     Review of Systems:  No other skin or systemic complaints except as noted in HPI or Assessment and Plan.  Objective  Well appearing patient in no apparent distress; mood and affect are within normal limits.  A full examination was performed including scalp, head, eyes, ears, nose, lips, neck, chest, axillae, abdomen, back, buttocks, bilateral upper extremities, bilateral lower extremities, hands, feet, fingers, toes, fingernails, and toenails. All findings within normal limits unless otherwise noted below.  Left Eye Pink bx site  R nasal sidewall Well healed scar with no evidence of recurrence.   nose Telangiectasias nose  Right Lateral Popliteal Firm pink/brown papulenodule with dimple sign.    Assessment & Plan   Purpura - Chronic; persistent and recurrent.  Treatable, but not curable. - Violaceous macules and patches - Benign - Related to trauma, age, sun damage and/or use of blood thinners, chronic use of topical and/or oral steroids - Observe - Can use OTC arnica containing moisturizer such as Dermend Bruise Formula if desired - Call for worsening or other concerns  Lentigines - Scattered tan macules - Due to sun exposure - Benign-appering, observe - Recommend daily broad spectrum sunscreen SPF 30+ to sun-exposed areas, reapply every 2 hours as needed. - Call for any changes  Seborrheic Keratoses - Stuck-on, waxy, tan-brown papules and/or plaques  -  Benign-appearing - Discussed benign etiology and prognosis. - Observe - Call for any changes  Melanocytic Nevi - Tan-brown and/or pink-flesh-colored symmetric macules and papules - Benign appearing on exam today - Observation - Call clinic for new or changing moles - Recommend daily use of broad spectrum spf 30+ sunscreen to sun-exposed areas.   Hemangiomas - Red papules - Discussed benign nature - Observe - Call for any changes  Actinic Damage - Chronic condition, secondary to cumulative UV/sun exposure - diffuse scaly erythematous macules with underlying dyspigmentation - Recommend daily broad spectrum sunscreen SPF 30+ to sun-exposed areas, reapply every 2 hours as needed.  - Staying in the shade or wearing long sleeves, sun glasses (UVA+UVB protection) and wide brim hats (4-inch brim around the entire circumference of the hat) are also recommended for sun protection.  - Call for new or changing lesions.  Skin cancer screening performed today.  Basal cell carcinoma (BCC) of skin of left lower eyelid including canthus Left Eye Bx proven from Dr. Sabino Dick -ophthalmologist at [facility], and pt is scheduled with Dr. Manley Mason for [facility]  History of basal cell carcinoma (BCC) R nasal sidewall Clear. Observe for recurrence. Call clinic for new or changing lesions.  Recommend regular skin exams, daily broad-spectrum spf 30+ sunscreen use, and photoprotection.    Telangiectasia nose Benign Discussed BBL, $350 per treatment session, several sessions for best results.  Dermatofibroma Right Lateral Popliteal Benign-appearing.  Observation.  Call clinic for new or changing moles.  Recommend daily use of broad spectrum spf 30+ sunscreen to sun-exposed areas.  Skin cancer screening  Return in about 6 months (around 06/28/2021) for 6-12 months, TBSE, may schedule for BBL .  I, Othelia Pulling, RMA, am acting as scribe for Sarina Ser, MD . Documentation: I have  reviewed the above documentation for accuracy and completeness, and I agree with the above.  Sarina Ser, MD

## 2020-12-26 NOTE — Patient Instructions (Signed)

## 2021-01-03 DIAGNOSIS — Z1231 Encounter for screening mammogram for malignant neoplasm of breast: Secondary | ICD-10-CM | POA: Diagnosis not present

## 2021-01-03 DIAGNOSIS — M85852 Other specified disorders of bone density and structure, left thigh: Secondary | ICD-10-CM | POA: Diagnosis not present

## 2021-01-03 DIAGNOSIS — M85851 Other specified disorders of bone density and structure, right thigh: Secondary | ICD-10-CM | POA: Diagnosis not present

## 2021-02-21 DIAGNOSIS — K219 Gastro-esophageal reflux disease without esophagitis: Secondary | ICD-10-CM | POA: Diagnosis not present

## 2021-02-21 DIAGNOSIS — H6063 Unspecified chronic otitis externa, bilateral: Secondary | ICD-10-CM | POA: Diagnosis not present

## 2021-02-21 DIAGNOSIS — E041 Nontoxic single thyroid nodule: Secondary | ICD-10-CM | POA: Diagnosis not present

## 2021-02-25 DIAGNOSIS — M25512 Pain in left shoulder: Secondary | ICD-10-CM | POA: Diagnosis not present

## 2021-03-27 ENCOUNTER — Ambulatory Visit: Payer: PPO | Admitting: Dermatology

## 2021-03-28 DIAGNOSIS — C441192 Basal cell carcinoma of skin of left lower eyelid, including canthus: Secondary | ICD-10-CM | POA: Diagnosis not present

## 2021-03-29 DIAGNOSIS — C44111 Basal cell carcinoma of skin of unspecified eyelid, including canthus: Secondary | ICD-10-CM | POA: Diagnosis not present

## 2021-03-29 DIAGNOSIS — K219 Gastro-esophageal reflux disease without esophagitis: Secondary | ICD-10-CM | POA: Diagnosis not present

## 2021-03-29 DIAGNOSIS — C441192 Basal cell carcinoma of skin of left lower eyelid, including canthus: Secondary | ICD-10-CM | POA: Diagnosis not present

## 2021-03-29 DIAGNOSIS — I10 Essential (primary) hypertension: Secondary | ICD-10-CM | POA: Diagnosis not present

## 2021-03-29 DIAGNOSIS — Z880 Allergy status to penicillin: Secondary | ICD-10-CM | POA: Diagnosis not present

## 2021-04-04 ENCOUNTER — Other Ambulatory Visit: Payer: Self-pay

## 2021-04-04 ENCOUNTER — Ambulatory Visit: Payer: PPO | Admitting: Dermatology

## 2021-04-04 DIAGNOSIS — L409 Psoriasis, unspecified: Secondary | ICD-10-CM

## 2021-04-04 DIAGNOSIS — L821 Other seborrheic keratosis: Secondary | ICD-10-CM | POA: Diagnosis not present

## 2021-04-04 DIAGNOSIS — L578 Other skin changes due to chronic exposure to nonionizing radiation: Secondary | ICD-10-CM

## 2021-04-04 DIAGNOSIS — Z85828 Personal history of other malignant neoplasm of skin: Secondary | ICD-10-CM

## 2021-04-04 DIAGNOSIS — L820 Inflamed seborrheic keratosis: Secondary | ICD-10-CM | POA: Diagnosis not present

## 2021-04-04 DIAGNOSIS — Z840 Family history of diseases of the skin and subcutaneous tissue: Secondary | ICD-10-CM

## 2021-04-04 NOTE — Progress Notes (Signed)
   Follow-Up Visit   Subjective  Shelley Sims is a 75 y.o. female who presents for the following: Follow-up (Patient here today for a spot at right forehead. Patient states it has been there for a month and a half. ).  She has had basal cell carcinoma and is concerned about new growth growth of skin cancers.  She has significant history of sun exposure in the past.  The following portions of the chart were reviewed this encounter and updated as appropriate:  Tobacco  Allergies  Meds  Problems  Med Hx  Surg Hx  Fam Hx     Review of Systems: No other skin or systemic complaints except as noted in HPI or Assessment and Plan.  Objective  Well appearing patient in no apparent distress; mood and affect are within normal limits.  A focused examination was performed including forehead. Relevant physical exam findings are noted in the Assessment and Plan.  right forehead x 3 (3) Erythematous keratotic or waxy stuck-on papule or plaque.   Assessment & Plan  Inflamed seborrheic keratosis right forehead x 3  Destruction of lesion - right forehead x 3 Complexity: simple   Destruction method: cryotherapy   Informed consent: discussed and consent obtained   Timeout:  patient name, date of birth, surgical site, and procedure verified Lesion destroyed using liquid nitrogen: Yes   Region frozen until ice ball extended beyond lesion: Yes   Outcome: patient tolerated procedure well with no complications   Post-procedure details: wound care instructions given   Additional details:  Prior to procedure, discussed risks of blister formation, small wound, skin dyspigmentation, or rare scar following cryotherapy. Recommend Vaseline ointment to treated areas while healing.  Actinic Damage - chronic, secondary to cumulative UV radiation exposure/sun exposure over time - diffuse scaly erythematous macules with underlying dyspigmentation - Recommend daily broad spectrum sunscreen SPF 30+ to sun-exposed  areas, reapply every 2 hours as needed.  - Recommend staying in the shade or wearing long sleeves, sun glasses (UVA+UVB protection) and wide brim hats (4-inch brim around the entire circumference of the hat). - Call for new or changing lesions.  History of Basal Cell Carcinoma of the Skin - No evidence of recurrence today - Recommend regular full body skin exams - Recommend daily broad spectrum sunscreen SPF 30+ to sun-exposed areas, reapply every 2 hours as needed.  - Call if any new or changing lesions are noted between office visits  Seborrheic Keratoses - Stuck-on, waxy, tan-brown papules and/or plaques  - Benign-appearing - Discussed benign etiology and prognosis. - Observe - Call for any changes  Psoriasis with + FH psoriasis Continue prn Mometasone solution and keratolytic shampoos. Psoriasis is a chronic non-curable, but treatable genetic/hereditary disease that may have other systemic features affecting other organ systems such as joints (Psoriatic Arthritis). It is associated with an increased risk of inflammatory bowel disease, heart disease, non-alcoholic fatty liver disease, and depression.    Return if symptoms worsen or fail to improve. [name], CMA, am acting as scribe for Sarina Ser, MD. Documentation: I have reviewed the above documentation for accuracy and completeness, and I agree with the above.  Sarina Ser, MD

## 2021-04-04 NOTE — Patient Instructions (Addendum)
Seborrheic Keratosis  What causes seborrheic keratoses? Seborrheic keratoses are harmless, common skin growths that first appear during adult life.  As time goes by, more growths appear.  Some people may develop a large number of them.  Seborrheic keratoses appear on both covered and uncovered body parts.  They are not caused by sunlight.  The tendency to develop seborrheic keratoses can be inherited.  They vary in color from skin-colored to gray, brown, or even black.  They can be either smooth or have a rough, warty surface.   Seborrheic keratoses are superficial and look as if they were stuck on the skin.  Under the microscope this type of keratosis looks like layers upon layers of skin.  That is why at times the top layer may seem to fall off, but the rest of the growth remains and re-grows.    Treatment Seborrheic keratoses do not need to be treated, but can easily be removed in the office.  Seborrheic keratoses often cause symptoms when they rub on clothing or jewelry.  Lesions can be in the way of shaving.  If they become inflamed, they can cause itching, soreness, or burning.  Removal of a seborrheic keratosis can be accomplished by freezing, burning, or surgery. If any spot bleeds, scabs, or grows rapidly, please return to have it checked, as these can be an indication of a skin cancer.  Cryotherapy Aftercare  Wash gently with soap and water everyday.   Apply Vaseline and Band-Aid daily until healed.      If you have any questions or concerns for your doctor, please call our main line at [phone] and press option 4 to reach your doctor's medical assistant. If no one answers, please leave a voicemail as directed and we will return your call as soon as possible. Messages left after 4 pm will be answered the following business day.   You may also send [place] a message via [place]. We typically respond to MyChart messages within 1-2 business days.  For prescription refills, please ask your  pharmacy to contact our office. Our fax number is [phone].  If you have an urgent issue when the clinic is closed that cannot wait until the next business day, you can page your doctor at the number below.    Please note that while we do our best to be available for urgent issues outside of office hours, we are not available 24/7.   If you have an urgent issue and are unable to reach [place], you may choose to seek medical care at your doctor's office, retail clinic, urgent care center, or emergency room.  If you have a medical emergency, please immediately call 911 or go to the emergency department.  Pager Numbers  - Dr. Nehemiah Massed: [phone]  - Dr. Laurence Ferrari: [phone]  - Dr. Nicole Kindred: [phone]  In the event of inclement weather, please call our main line at [phone] for an update on the status of any delays or closures.  Dermatology Medication Tips: Please keep the boxes that topical medications come in in order to help keep track of the instructions about where and how to use these. Pharmacies typically print the medication instructions only on the boxes and not directly on the medication tubes.   If your medication is too expensive, please contact our office at [phone] option 4 or send [place] a message through [place].   We are unable to tell what your co-pay for medications will be in advance as this is different depending on your  insurance coverage. However, we may be able to find a substitute medication at lower cost or fill out paperwork to get insurance to cover a needed medication.   If a prior authorization is required to get your medication covered by your insurance company, please allow [place] 1-2 business days to complete this process.  Drug prices often vary depending on where the prescription is filled and some pharmacies may offer cheaper prices.  The website www.goodrx.com contains coupons for medications through different pharmacies. The prices here do not  account for what the cost may be with help from insurance (it may be cheaper with your insurance), but the website can give you the price if you did not use any insurance.  - You can print the associated coupon and take it with your prescription to the pharmacy.  - You may also stop by our office during regular business hours and pick up a GoodRx coupon card.  - If you need your prescription sent electronically to a different pharmacy, notify our office through [facility] or by phone at [phone] option 4.

## 2021-04-09 ENCOUNTER — Encounter: Payer: Self-pay | Admitting: Dermatology

## 2021-05-21 DIAGNOSIS — M19012 Primary osteoarthritis, left shoulder: Secondary | ICD-10-CM | POA: Diagnosis not present

## 2021-06-03 DIAGNOSIS — H02834 Dermatochalasis of left upper eyelid: Secondary | ICD-10-CM | POA: Diagnosis not present

## 2021-06-03 DIAGNOSIS — H02403 Unspecified ptosis of bilateral eyelids: Secondary | ICD-10-CM | POA: Diagnosis not present

## 2021-06-03 DIAGNOSIS — H02831 Dermatochalasis of right upper eyelid: Secondary | ICD-10-CM | POA: Diagnosis not present

## 2021-06-12 DIAGNOSIS — F439 Reaction to severe stress, unspecified: Secondary | ICD-10-CM | POA: Diagnosis not present

## 2021-06-12 DIAGNOSIS — F419 Anxiety disorder, unspecified: Secondary | ICD-10-CM | POA: Insufficient documentation

## 2021-06-12 DIAGNOSIS — I10 Essential (primary) hypertension: Secondary | ICD-10-CM | POA: Diagnosis not present

## 2021-06-27 ENCOUNTER — Encounter (HOSPITAL_COMMUNITY): Admission: RE | Admit: 2021-06-27 | Payer: PPO | Source: Ambulatory Visit

## 2021-07-03 ENCOUNTER — Ambulatory Visit: Payer: PPO | Admitting: Dermatology

## 2021-07-11 ENCOUNTER — Ambulatory Visit: Payer: PPO | Admitting: Dermatology

## 2021-07-16 DIAGNOSIS — M79642 Pain in left hand: Secondary | ICD-10-CM | POA: Diagnosis not present

## 2021-07-16 DIAGNOSIS — M79641 Pain in right hand: Secondary | ICD-10-CM | POA: Diagnosis not present

## 2021-07-16 DIAGNOSIS — M25561 Pain in right knee: Secondary | ICD-10-CM | POA: Diagnosis not present

## 2021-07-16 DIAGNOSIS — Z0000 Encounter for general adult medical examination without abnormal findings: Secondary | ICD-10-CM | POA: Diagnosis not present

## 2021-07-16 DIAGNOSIS — M25512 Pain in left shoulder: Secondary | ICD-10-CM | POA: Diagnosis not present

## 2021-07-16 DIAGNOSIS — M8580 Other specified disorders of bone density and structure, unspecified site: Secondary | ICD-10-CM | POA: Diagnosis not present

## 2021-07-16 DIAGNOSIS — G8929 Other chronic pain: Secondary | ICD-10-CM | POA: Diagnosis not present

## 2021-07-16 DIAGNOSIS — I10 Essential (primary) hypertension: Secondary | ICD-10-CM | POA: Diagnosis not present

## 2021-07-16 DIAGNOSIS — F419 Anxiety disorder, unspecified: Secondary | ICD-10-CM | POA: Diagnosis not present

## 2021-07-17 DIAGNOSIS — M25561 Pain in right knee: Secondary | ICD-10-CM | POA: Diagnosis not present

## 2021-07-24 DIAGNOSIS — M25561 Pain in right knee: Secondary | ICD-10-CM | POA: Diagnosis not present

## 2021-07-24 DIAGNOSIS — M1711 Unilateral primary osteoarthritis, right knee: Secondary | ICD-10-CM | POA: Diagnosis not present

## 2021-07-24 DIAGNOSIS — G8929 Other chronic pain: Secondary | ICD-10-CM | POA: Diagnosis not present

## 2021-07-26 DIAGNOSIS — M79641 Pain in right hand: Secondary | ICD-10-CM | POA: Diagnosis not present

## 2021-07-26 DIAGNOSIS — E559 Vitamin D deficiency, unspecified: Secondary | ICD-10-CM | POA: Diagnosis not present

## 2021-07-26 DIAGNOSIS — Z0000 Encounter for general adult medical examination without abnormal findings: Secondary | ICD-10-CM | POA: Diagnosis not present

## 2021-07-30 DIAGNOSIS — H353131 Nonexudative age-related macular degeneration, bilateral, early dry stage: Secondary | ICD-10-CM | POA: Diagnosis not present

## 2021-08-02 ENCOUNTER — Encounter (HOSPITAL_COMMUNITY): Payer: PPO

## 2021-08-02 DIAGNOSIS — M25561 Pain in right knee: Secondary | ICD-10-CM | POA: Diagnosis not present

## 2021-08-02 DIAGNOSIS — M25661 Stiffness of right knee, not elsewhere classified: Secondary | ICD-10-CM | POA: Diagnosis not present

## 2021-08-02 DIAGNOSIS — M6281 Muscle weakness (generalized): Secondary | ICD-10-CM | POA: Diagnosis not present

## 2021-08-06 DIAGNOSIS — M6281 Muscle weakness (generalized): Secondary | ICD-10-CM | POA: Diagnosis not present

## 2021-08-06 DIAGNOSIS — M25561 Pain in right knee: Secondary | ICD-10-CM | POA: Diagnosis not present

## 2021-08-06 DIAGNOSIS — M25661 Stiffness of right knee, not elsewhere classified: Secondary | ICD-10-CM | POA: Diagnosis not present

## 2021-08-08 DIAGNOSIS — H02834 Dermatochalasis of left upper eyelid: Secondary | ICD-10-CM | POA: Diagnosis not present

## 2021-08-08 DIAGNOSIS — H02831 Dermatochalasis of right upper eyelid: Secondary | ICD-10-CM | POA: Diagnosis not present

## 2021-08-22 ENCOUNTER — Ambulatory Visit: Admit: 2021-08-22 | Payer: PPO | Admitting: Orthopedic Surgery

## 2021-08-22 SURGERY — ARTHROPLASTY, SHOULDER, TOTAL
Anesthesia: General | Site: Shoulder | Laterality: Left

## 2021-08-27 DIAGNOSIS — M25661 Stiffness of right knee, not elsewhere classified: Secondary | ICD-10-CM | POA: Diagnosis not present

## 2021-08-27 DIAGNOSIS — M25561 Pain in right knee: Secondary | ICD-10-CM | POA: Diagnosis not present

## 2021-08-27 DIAGNOSIS — M6281 Muscle weakness (generalized): Secondary | ICD-10-CM | POA: Diagnosis not present

## 2021-08-29 DIAGNOSIS — M6281 Muscle weakness (generalized): Secondary | ICD-10-CM | POA: Diagnosis not present

## 2021-08-29 DIAGNOSIS — M25661 Stiffness of right knee, not elsewhere classified: Secondary | ICD-10-CM | POA: Diagnosis not present

## 2021-08-29 DIAGNOSIS — M25561 Pain in right knee: Secondary | ICD-10-CM | POA: Diagnosis not present

## 2021-09-06 DIAGNOSIS — M25561 Pain in right knee: Secondary | ICD-10-CM | POA: Diagnosis not present

## 2021-09-06 DIAGNOSIS — M25661 Stiffness of right knee, not elsewhere classified: Secondary | ICD-10-CM | POA: Diagnosis not present

## 2021-09-06 DIAGNOSIS — M6281 Muscle weakness (generalized): Secondary | ICD-10-CM | POA: Diagnosis not present

## 2021-09-10 DIAGNOSIS — M6281 Muscle weakness (generalized): Secondary | ICD-10-CM | POA: Diagnosis not present

## 2021-09-10 DIAGNOSIS — M25661 Stiffness of right knee, not elsewhere classified: Secondary | ICD-10-CM | POA: Diagnosis not present

## 2021-09-10 DIAGNOSIS — M25561 Pain in right knee: Secondary | ICD-10-CM | POA: Diagnosis not present

## 2021-09-12 DIAGNOSIS — S92911A Unspecified fracture of right toe(s), initial encounter for closed fracture: Secondary | ICD-10-CM | POA: Diagnosis not present

## 2021-09-17 DIAGNOSIS — K645 Perianal venous thrombosis: Secondary | ICD-10-CM | POA: Diagnosis not present

## 2021-09-18 ENCOUNTER — Ambulatory Visit: Payer: Medicare Other | Admitting: Podiatry

## 2021-09-24 DIAGNOSIS — S92911A Unspecified fracture of right toe(s), initial encounter for closed fracture: Secondary | ICD-10-CM | POA: Diagnosis not present

## 2021-10-22 DIAGNOSIS — S92911A Unspecified fracture of right toe(s), initial encounter for closed fracture: Secondary | ICD-10-CM | POA: Diagnosis not present

## 2021-12-06 DIAGNOSIS — H353131 Nonexudative age-related macular degeneration, bilateral, early dry stage: Secondary | ICD-10-CM | POA: Diagnosis not present

## 2021-12-06 DIAGNOSIS — H2513 Age-related nuclear cataract, bilateral: Secondary | ICD-10-CM | POA: Diagnosis not present

## 2021-12-06 DIAGNOSIS — M3501 Sicca syndrome with keratoconjunctivitis: Secondary | ICD-10-CM | POA: Diagnosis not present

## 2021-12-06 DIAGNOSIS — H04123 Dry eye syndrome of bilateral lacrimal glands: Secondary | ICD-10-CM | POA: Diagnosis not present

## 2022-01-02 ENCOUNTER — Ambulatory Visit: Payer: PPO | Admitting: Dermatology

## 2022-01-02 DIAGNOSIS — L409 Psoriasis, unspecified: Secondary | ICD-10-CM | POA: Diagnosis not present

## 2022-01-02 DIAGNOSIS — L814 Other melanin hyperpigmentation: Secondary | ICD-10-CM | POA: Diagnosis not present

## 2022-01-02 DIAGNOSIS — D229 Melanocytic nevi, unspecified: Secondary | ICD-10-CM | POA: Diagnosis not present

## 2022-01-02 DIAGNOSIS — Z85828 Personal history of other malignant neoplasm of skin: Secondary | ICD-10-CM | POA: Diagnosis not present

## 2022-01-02 DIAGNOSIS — L578 Other skin changes due to chronic exposure to nonionizing radiation: Secondary | ICD-10-CM | POA: Diagnosis not present

## 2022-01-02 DIAGNOSIS — L853 Xerosis cutis: Secondary | ICD-10-CM

## 2022-01-02 DIAGNOSIS — Z1283 Encounter for screening for malignant neoplasm of skin: Secondary | ICD-10-CM

## 2022-01-02 DIAGNOSIS — D692 Other nonthrombocytopenic purpura: Secondary | ICD-10-CM | POA: Diagnosis not present

## 2022-01-02 DIAGNOSIS — D1800 Hemangioma unspecified site: Secondary | ICD-10-CM | POA: Diagnosis not present

## 2022-01-02 NOTE — Progress Notes (Signed)
Follow-Up Visit   Subjective  Shelley Sims is a 76 y.o. female who presents for the following: Annual Exam (1 year tbse , hx bcc, isk and aks. ). The patient presents for Total-Body Skin Exam (TBSE) for skin cancer screening and mole check.  The patient has spots, moles and lesions to be evaluated, some may be new or changing and the patient has concerns that these could be cancer.  The following portions of the chart were reviewed this encounter and updated as appropriate:  Tobacco  Allergies  Meds  Problems  Med Hx  Surg Hx  Fam Hx     Review of Systems: No other skin or systemic complaints except as noted in HPI or Assessment and Plan.  Objective  Well appearing patient in no apparent distress; mood and affect are within normal limits.  A full examination was performed including scalp, head, eyes, ears, nose, lips, neck, chest, axillae, abdomen, back, buttocks, bilateral upper extremities, bilateral lower extremities, hands, feet, fingers, toes, fingernails, and toenails. All findings within normal limits unless otherwise noted below.   Assessment & Plan  Psoriasis scalp and ears Psoriasis is a chronic non-curable, but treatable genetic/hereditary disease that may have other systemic features affecting other organ systems such as joints (Psoriatic Arthritis). It is associated with an increased risk of inflammatory bowel disease, heart disease, non-alcoholic fatty liver disease, and depression.   Chronic condition with duration or expected duration over one year. Currently well-controlled.  Continue mometasone 0.1 % cream - apply to aa prn   Topical steroids (such as triamcinolone, fluocinolone, fluocinonide, mometasone, clobetasol, halobetasol, betamethasone, hydrocortisone) can cause thinning and lightening of the skin if they are used for too long in the same area. Your physician has selected the right strength medicine for your problem and area affected on the body. Please  use your medication only as directed by your physician to prevent side effects.   Related Medications mometasone (ELOCON) 0.1 % cream Apply to scalp qd-bid prn  Lentigines - Scattered tan macules - Due to sun exposure - Benign-appearing, observe - Recommend daily broad spectrum sunscreen SPF 30+ to sun-exposed areas, reapply every 2 hours as needed. - Call for any changes  Purpura - Chronic; persistent and recurrent.  Treatable, but not curable. At arms - Violaceous macules and patches - Benign - Related to trauma, age, sun damage and/or use of blood thinners, chronic use of topical and/or oral steroids - Observe - Can use OTC arnica containing moisturizer such as Dermend Bruise Formula if desired - Call for worsening or other concerns  Seborrheic Keratoses - Stuck-on, waxy, tan-brown papules and/or plaques  - Benign-appearing - Discussed benign etiology and prognosis. - Observe - Call for any changes  Melanocytic Nevi - Tan-brown and/or pink-flesh-colored symmetric macules and papules - Benign appearing on exam today - Observation - Call clinic for new or changing moles - Recommend daily use of broad spectrum spf 30+ sunscreen to sun-exposed areas.   Hemangiomas - Red papules - Discussed benign nature - Observe - Call for any changes  Xerosis - diffuse xerotic patches - recommend gentle, hydrating skin care - gentle skin care handout given  Actinic Damage - Chronic condition, secondary to cumulative UV/sun exposure - diffuse scaly erythematous macules with underlying dyspigmentation - Recommend daily broad spectrum sunscreen SPF 30+ to sun-exposed areas, reapply every 2 hours as needed.  - Staying in the shade or wearing long sleeves, sun glasses (UVA+UVB protection) and wide brim hats (4-inch brim around  the entire circumference of the hat) are also recommended for sun protection.  - Call for new or changing lesions.  History of Basal Cell Carcinoma of the  Skin Multiple locations see history  - No evidence of recurrence today - Recommend regular full body skin exams - Recommend daily broad spectrum sunscreen SPF 30+ to sun-exposed areas, reapply every 2 hours as needed.  - Call if any new or changing lesions are noted between office visits  Skin cancer screening performed today. Return in about 1 year (around 01/03/2023) for TBSE. Shelley Sims, CMA, am acting as scribe for Sarina Ser, MD. Documentation: I have reviewed the above documentation for accuracy and completeness, and I agree with the above.  Sarina Ser, MD

## 2022-01-02 NOTE — Patient Instructions (Addendum)
For Bruising at arms  Can consider starting  a collagen supplement - take recommend dosage daily Can use OTC arnica containing moisturizer such as Dermend Bruise Formula if desired  Seborrheic Keratosis  What causes seborrheic keratoses? Seborrheic keratoses are harmless, common skin growths that first appear during adult life.  As time goes by, more growths appear.  Some people may develop a large number of them.  Seborrheic keratoses appear on both covered and uncovered body parts.  They are not caused by sunlight.  The tendency to develop seborrheic keratoses can be inherited.  They vary in color from skin-colored to gray, brown, or even black.  They can be either smooth or have a rough, warty surface.   Seborrheic keratoses are superficial and look as if they were stuck on the skin.  Under the microscope this type of keratosis looks like layers upon layers of skin.  That is why at times the top layer may seem to fall off, but the rest of the growth remains and re-grows.    Treatment Seborrheic keratoses do not need to be treated, but can easily be removed in the office.  Seborrheic keratoses often cause symptoms when they rub on clothing or jewelry.  Lesions can be in the way of shaving.  If they become inflamed, they can cause itching, soreness, or burning.  Removal of a seborrheic keratosis can be accomplished by freezing, burning, or surgery. If any spot bleeds, scabs, or grows rapidly, please return to have it checked, as these can be an indication of a skin cancer.   Melanoma ABCDEs  Melanoma is the most dangerous type of skin cancer, and is the leading cause of death from skin disease.  You are more likely to develop melanoma if you: Have light-colored skin, light-colored eyes, or red or blond hair Spend a lot of time in the sun Tan regularly, either outdoors or in a tanning bed Have had blistering sunburns, especially during childhood Have a close family member who has had a  melanoma Have atypical moles or large birthmarks  Early detection of melanoma is key since treatment is typically straightforward and cure rates are extremely high if we catch it early.   The first sign of melanoma is often a change in a mole or a new dark spot.  The ABCDE system is a way of remembering the signs of melanoma.  A for asymmetry:  The two halves do not match. B for border:  The edges of the growth are irregular. C for color:  A mixture of colors are present instead of an even brown color. D for diameter:  Melanomas are usually (but not always) greater than 50m - the size of a pencil eraser. E for evolution:  The spot keeps changing in size, shape, and color.  Please check your skin once per month between visits. You can use a small mirror in front and a large mirror behind you to keep an eye on the back side or your body.   If you see any new or changing lesions before your next follow-up, please call to schedule a visit.  Please continue daily skin protection including broad spectrum sunscreen SPF 30+ to sun-exposed areas, reapplying every 2 hours as needed when you're outdoors.   Staying in the shade or wearing long sleeves, sun glasses (UVA+UVB protection) and wide brim hats (4-inch brim around the entire circumference of the hat) are also recommended for sun protection.    Due to recent changes in healthcare  laws, you may see results of your pathology and/or laboratory studies on MyChart before the doctors have had a chance to review them. We understand that in some cases there may be results that are confusing or concerning to you. Please understand that not all results are received at the same time and often the doctors may need to interpret multiple results in order to provide you with the best plan of care or course of treatment. Therefore, we ask that you please give [place] 2 business days to thoroughly review all your results before contacting the office for clarification.  Should we see a critical lab result, you will be contacted sooner.   If You Need Anything After Your Visit  If you have any questions or concerns for your doctor, please call our main line at [phone] and press option 4 to reach your doctor's medical assistant. If no one answers, please leave a voicemail as directed and we will return your call as soon as possible. Messages left after 4 pm will be answered the following business day.   You may also send [place] a message via [place]. We typically respond to MyChart messages within 1-2 business days.  For prescription refills, please ask your pharmacy to contact our office. Our fax number is [phone].  If you have an urgent issue when the clinic is closed that cannot wait until the next business day, you can page your doctor at the number below.    Please note that while we do our best to be available for urgent issues outside of office hours, we are not available 24/7.   If you have an urgent issue and are unable to reach [place], you may choose to seek medical care at your doctor's office, retail clinic, urgent care center, or emergency room.  If you have a medical emergency, please immediately call 911 or go to the emergency department.  Pager Numbers  - Dr. Nehemiah Massed: [phone]  - Dr. Laurence Ferrari: [phone]  - Dr. Nicole Kindred: [phone]  In the event of inclement weather, please call our main line at [phone] for an update on the status of any delays or closures.  Dermatology Medication Tips: Please keep the boxes that topical medications come in in order to help keep track of the instructions about where and how to use these. Pharmacies typically print the medication instructions only on the boxes and not directly on the medication tubes.   If your medication is too expensive, please contact our office at [phone] option 4 or send [place] a message through [place].   We are unable to tell what your co-pay for medications will be  in advance as this is different depending on your insurance coverage. However, we may be able to find a substitute medication at lower cost or fill out paperwork to get insurance to cover a needed medication.   If a prior authorization is required to get your medication covered by your insurance company, please allow [place] 1-2 business days to complete this process.  Drug prices often vary depending on where the prescription is filled and some pharmacies may offer cheaper prices.  The website www.goodrx.com contains coupons for medications through different pharmacies. The prices here do not account for what the cost may be with help from insurance (it may be cheaper with your insurance), but the website can give you the price if you did not use any insurance.  - You can print the associated coupon and take it with your prescription to the pharmacy.  -  You may also stop by our office during regular business hours and pick up a GoodRx coupon card.  - If you need your prescription sent electronically to a different pharmacy, notify our office through [facility] or by phone at [phone] option 4.     Si Usted Necesita Algo Despus de Su Visita  Tambin puede enviarnos un mensaje a travs de Pharmacist, community. Por lo general respondemos a los mensajes de MyChart en el transcurso de 1 a 2 das hbiles.  Para renovar recetas, por favor pida a su farmacia que se ponga en contacto con nuestra oficina. Harland Dingwall de fax es [place] [phone].  Si tiene un asunto urgente cuando la clnica est cerrada y que no puede esperar hasta el siguiente da hbil, puede llamar/localizar a su doctor(a) al nmero que aparece a continuacin.   Por favor, tenga en cuenta que aunque hacemos todo lo posible para estar disponibles para asuntos urgentes fuera del horario de [place], no estamos disponibles las 24 horas del da, los 7 das de la [place].   Si tiene un problema urgente y no puede comunicarse con nosotros,  puede optar por buscar atencin mdica  en el consultorio de su doctor(a), en una clnica privada, en un centro de atencin urgente o en una sala de emergencias.  Si tiene Engineering geologist, por favor llame inmediatamente al 911 o vaya a la sala de emergencias.  Nmeros de bper  - Dr. Nehemiah Massed: [phone]  - Dra. [name]: [phone]  - Dra. Nicole Kindred: [phone]  En caso de inclemencias del [place], por favor llame a Johnsie Kindred principal al [phone] para una actualizacin sobre el [place] de cualquier retraso o cierre.  Consejos para la medicacin en dermatologa: Por favor, guarde las cajas en las que vienen los medicamentos de uso tpico para ayudarle a seguir las instrucciones sobre dnde y cmo usarlos. Las farmacias generalmente imprimen las instrucciones del medicamento slo en las cajas y no directamente en los tubos del [place].   Si su medicamento es muy caro, por favor, pngase en contacto con Zigmund Daniel llamando al [phone] y presione la opcin 4 o envenos un mensaje a travs de Pharmacist, community.   No podemos decirle cul ser su copago por los medicamentos por adelantado ya que esto es diferente dependiendo de la cobertura de su seguro. Sin embargo, es posible que podamos encontrar un medicamento sustituto a Electrical engineer un formulario para que el seguro cubra el medicamento que se considera necesario.   Si se requiere una autorizacin previa para que su compaa de seguros [place] su medicamento, por favor permtanos de 1 a 2 das hbiles para completar este proceso.  Los precios de los medicamentos varan con frecuencia dependiendo del Environmental consultant de dnde se surte la receta y alguna farmacias pueden ofrecer precios ms baratos.  El sitio web www.goodrx.com tiene cupones para medicamentos de Airline pilot. Los precios aqu no tienen en cuenta lo que podra costar con la ayuda del seguro (puede ser ms barato con su seguro), pero el sitio web puede darle el  precio si no utiliz Research scientist (physical sciences).  - Puede imprimir el cupn correspondiente y llevarlo con su receta a la farmacia.  - Tambin puede pasar por nuestra oficina durante el horario de atencin regular y Charity fundraiser una tarjeta de cupones de GoodRx.  - Si necesita que su receta se enve electrnicamente a Chiropodist, informe a nuestra oficina a travs de MyChart de [place] o por telfono llamando al [phone] y  presione la opcin 4.

## 2022-01-06 ENCOUNTER — Encounter: Payer: Self-pay | Admitting: Dermatology

## 2022-01-09 ENCOUNTER — Ambulatory Visit (INDEPENDENT_AMBULATORY_CARE_PROVIDER_SITE_OTHER): Payer: PPO

## 2022-01-09 ENCOUNTER — Ambulatory Visit: Payer: PPO | Admitting: Podiatry

## 2022-01-09 DIAGNOSIS — T148XXA Other injury of unspecified body region, initial encounter: Secondary | ICD-10-CM

## 2022-01-09 DIAGNOSIS — Z1231 Encounter for screening mammogram for malignant neoplasm of breast: Secondary | ICD-10-CM | POA: Diagnosis not present

## 2022-01-09 NOTE — Progress Notes (Signed)
Subjective:  Patient ID: Shelley Sims, female    DOB: 05-16-1946,  MRN: [id number]  Chief Complaint  Patient presents with   Foot Pain    Left foot pain on the top of the foot.     76 y.o. female presents with the above complaint.  Patient presents with concern for left foot capsulitis.  Patient states is painful to touch is progressive gotten worse.  She wanted to get it evaluated.  She states that it is mildly painful.  She is going away in 1 week and 3 weeks.  She wanted to make sure is not a stress fracture.  She had a true stress fracture 25 years ago.  She denies any other acute current pain scale is 2 out of 10 it is getting better.  She states that she tried new shoes that may have led to this.   Review of Systems: Negative except as noted in the HPI. Denies N/V/F/Ch.  Past Medical History:  Diagnosis Date   Atypical mole 09/03/2017   right low back 4.0cm lat to spine   BCC (basal cell carcinoma of skin)    L eye, pt is scheduled for [facility] with Dr. Manley Mason 03/28/21   History of basal cell carcinoma ([place]) 02/27/2020   right nasal sidewall   Hypertension     Current Outpatient Medications:    ALPRAZolam (XANAX) 0.5 MG tablet, TAKE ONE-HALF TO ONE TABLET EVERY EIGHT HOURS AS NEEDED, Disp: , Rfl:    Calcium Carbonate-Vitamin D 600-200 MG-UNIT TABS, Take by mouth., Disp: , Rfl:    clobetasol cream (TEMOVATE) 0.05 %, Apply to skin  twice daily as needed to affected areas for two weeks then apply twice daily on weekends only, Disp: 30 g, Rfl: 1   Clobetasol Prop Emollient Base (CLOBETASOL PROPIONATE E) 0.05 % emollient cream, Apply to skin  twice daily as needed to affected areas for two weeks then apply twice daily on weekends only, Disp: 30 g, Rfl: 1   escitalopram (LEXAPRO) 10 MG tablet, , Disp: , Rfl:    mometasone (ELOCON) 0.1 % cream, Apply to scalp qd-bid prn, Disp: 45 g, Rfl: 6   nitroGLYCERIN (NITRODUR - DOSED IN MG/24 HR) 0.2 mg/hr patch, USE 1/4 PATCH FOR 24 HOURS TO THE  AFFECTED AREA, Disp: 30 patch, Rfl: 6   olmesartan (BENICAR) 20 MG tablet, Take 20 mg by mouth daily., Disp: , Rfl:    omeprazole (PRILOSEC) 20 MG capsule, , Disp: , Rfl:    TURMERIC PO, Take by mouth., Disp: , Rfl:    Zinc Sulfate (ZINC 15 PO), Take by mouth., Disp: , Rfl:   Social History   Tobacco Use  Smoking Status Never  Smokeless Tobacco Never    Allergies  Allergen Reactions   Penicillins     REACTION: hives   Objective:  There were no vitals filed for this visit. There is no height or weight on file to calculate BMI. Constitutional Well developed. Well nourished.  Vascular Dorsalis pedis pulses palpable bilaterally. Posterior tibial pulses palpable bilaterally. Capillary refill normal to all digits.  No cyanosis or clubbing noted. Pedal hair growth normal.  Neurologic Normal speech. Oriented to person, place, and time. Epicritic sensation to light touch grossly present bilaterally.  Dermatologic Nails well groomed and normal in appearance. No open wounds. No skin lesions.  Orthopedic: Pain on palpation to the along the fifth and fourth metatarsal.  No pain at the midfoot joints no extensor or flexor tendinitis noted.  No capsulitis  at the metatarsophalangeal joint of fourth and fifth.   Radiographs: 3 views of skeletally mature adult left foot: No stress fracture noted.  Generalized osteopenia noted.  No bony abnormalities noted.  Midfoot and hindfoot arthritis noted. Assessment:   1. Bone bruise    Plan:  Patient was evaluated and treated and all questions answered.  Left fourth and fifth metatarsal bone bruise/capsulitis -All questions and concerns were discussed with the patient in extensive detail.  Given that her pain is improving at this time she can continue using regular shoes.  If her pain regresses I have asked her to come back and see me.  She denies any other acute complaints and agrees with the plan.  No follow-ups on file.

## 2022-01-14 DIAGNOSIS — R922 Inconclusive mammogram: Secondary | ICD-10-CM | POA: Diagnosis not present

## 2022-01-14 DIAGNOSIS — R928 Other abnormal and inconclusive findings on diagnostic imaging of breast: Secondary | ICD-10-CM | POA: Diagnosis not present

## 2022-01-14 DIAGNOSIS — N6489 Other specified disorders of breast: Secondary | ICD-10-CM | POA: Diagnosis not present

## 2022-02-26 DIAGNOSIS — Z8616 Personal history of COVID-19: Secondary | ICD-10-CM | POA: Diagnosis not present

## 2022-02-26 DIAGNOSIS — R058 Other specified cough: Secondary | ICD-10-CM | POA: Diagnosis not present

## 2022-03-17 ENCOUNTER — Encounter: Payer: Self-pay | Admitting: *Deleted

## 2022-03-17 DIAGNOSIS — E041 Nontoxic single thyroid nodule: Secondary | ICD-10-CM | POA: Diagnosis not present

## 2022-03-17 DIAGNOSIS — R059 Cough, unspecified: Secondary | ICD-10-CM | POA: Diagnosis not present

## 2022-06-02 DIAGNOSIS — R059 Cough, unspecified: Secondary | ICD-10-CM | POA: Diagnosis not present

## 2022-06-02 DIAGNOSIS — R509 Fever, unspecified: Secondary | ICD-10-CM | POA: Diagnosis not present

## 2022-06-03 DIAGNOSIS — J069 Acute upper respiratory infection, unspecified: Secondary | ICD-10-CM | POA: Diagnosis not present

## 2022-07-31 DIAGNOSIS — H353131 Nonexudative age-related macular degeneration, bilateral, early dry stage: Secondary | ICD-10-CM | POA: Diagnosis not present

## 2022-07-31 DIAGNOSIS — H2513 Age-related nuclear cataract, bilateral: Secondary | ICD-10-CM | POA: Diagnosis not present

## 2022-08-19 ENCOUNTER — Encounter: Payer: Self-pay | Admitting: Dermatology

## 2022-08-19 ENCOUNTER — Ambulatory Visit: Payer: PPO | Admitting: Dermatology

## 2022-08-19 DIAGNOSIS — L821 Other seborrheic keratosis: Secondary | ICD-10-CM

## 2022-08-19 DIAGNOSIS — L578 Other skin changes due to chronic exposure to nonionizing radiation: Secondary | ICD-10-CM | POA: Diagnosis not present

## 2022-08-19 DIAGNOSIS — L988 Other specified disorders of the skin and subcutaneous tissue: Secondary | ICD-10-CM | POA: Diagnosis not present

## 2022-08-19 MED ORDER — AMBULATORY NON FORMULARY MEDICATION: 2 refills | Status: AC

## 2022-08-19 NOTE — Patient Instructions (Addendum)
Recommended non-comedogenic (non-acne causing) facial oils include 100% argan oil or squalane. The can be used after applying any recommended creams or ointments to the skin in the evening. The Ordinary Brand has a high-quality and affordable version of both of these and can be found at [place].  Gentle Skin Care Guide  1. Bathe no more than once a day.  2. Avoid bathing in hot water  3. Use a mild soap like Dove, Vanicream, Cetaphil, CeraVe. Can use Lever 2000 or Cetaphil antibacterial soap  4. Use soap only where you need it. On most days, use it under your arms, between your legs, and on your feet. Let the water rinse other areas unless visibly dirty.  5. When you get out of the bath/shower, use a towel to gently blot your skin dry, don't rub it.  6. While your skin is still a little damp, apply a moisturizing cream such as Vanicream, CeraVe, Cetaphil, Eucerin, Sarna lotion or plain Vaseline Jelly. For hands apply Neutrogena [place] Hand Cream or Excipial Hand Cream.  7. Reapply moisturizer any time you start to itch or feel dry.  8. Sometimes using free and clear laundry detergents can be helpful. Fabric softener sheets should be avoided. Downy Free & Gentle liquid, or any liquid fabric softener that is free of dyes and perfumes, it acceptable to use  9. If your doctor has given you prescription creams you may apply moisturizers over them   Instructions for [facility] Medications  One or more of your medications was sent to the [facility] mail order compounding pharmacy. You will receive an email from them and can purchase the medicine through that link. It will then be mailed to your home at the address you confirmed. If for any reason you do not receive an email from them, please check your spam folder. If you still do not find the email, please let us know. [facility] phone number is [phone].  Will prescribe [facility] Anti-Aging Tretinoin  0.0125%/Niacinamide/Vitamin C/Vitamin E/Turmeric/Resveratrol with Hyaluronic Acid. Apply pea sized amount nightly to the entire face.  The patient was advised this is not covered by insurance since it is made by a compounding pharmacy. They will receive an email to check out and the medication will be mailed to their home.   Topical retinoid medications like tretinoin can cause dryness and irritation when first started. Only apply a pea-sized amount to the entire affected area. Avoid applying it around the eyes, edges of mouth and creases at the nose. If you experience irritation, use a good moisturizer first and/or apply the medicine less often. If you are doing well with the medicine, you can increase how often you use it until you are applying every night. Be careful with sun protection while using this medication as it can make you sensitive to the sun. This medicine should not be used by pregnant women.    Seborrheic Keratosis  What causes seborrheic keratoses? Seborrheic keratoses are harmless, common skin growths that first appear during adult life.  As time goes by, more growths appear.  Some people may develop a large number of them.  Seborrheic keratoses appear on both covered and uncovered body parts.  They are not caused by sunlight.  The tendency to develop seborrheic keratoses can be inherited.  They vary in color from skin-colored to gray, brown, or even black.  They can be either smooth or have a rough, warty surface.   Seborrheic keratoses are superficial and look  as if they were stuck on the skin.  Under the microscope this type of keratosis looks like layers upon layers of skin.  That is why at times the top layer may seem to fall off, but the rest of the growth remains and re-grows.    Treatment Seborrheic keratoses do not need to be treated, but can easily be removed in the office.  Seborrheic keratoses often cause symptoms when they rub on clothing or jewelry.  Lesions can be in the  way of shaving.  If they become inflamed, they can cause itching, soreness, or burning.  Removal of a seborrheic keratosis can be accomplished by freezing, burning, or surgery. If any spot bleeds, scabs, or grows rapidly, please return to have it checked, as these can be an indication of a skin cancer.  Due to recent changes in healthcare laws, you may see results of your pathology and/or laboratory studies on MyChart before the doctors have had a chance to review them. We understand that in some cases there may be results that are confusing or concerning to you. Please understand that not all results are received at the same time and often the doctors may need to interpret multiple results in order to provide you with the best plan of care or course of treatment. Therefore, we ask that you please give [place] 2 business days to thoroughly review all your results before contacting the office for clarification. Should we see a critical lab result, you will be contacted sooner.   If You Need Anything After Your Visit  If you have any questions or concerns for your doctor, please call our main line at [phone] and press option 4 to reach your doctor's medical assistant. If no one answers, please leave a voicemail as directed and we will return your call as soon as possible. Messages left after 4 pm will be answered the following business day.   You may also send [place] a message via [place]. We typically respond to MyChart messages within 1-2 business days.  For prescription refills, please ask your pharmacy to contact our office. Our fax number is [phone].  If you have an urgent issue when the clinic is closed that cannot wait until the next business day, you can page your doctor at the number below.    Please note that while we do our best to be available for urgent issues outside of office hours, we are not available 24/7.   If you have an urgent issue and are unable to reach [place], you may choose to  seek medical care at your doctor's office, retail clinic, urgent care center, or emergency room.  If you have a medical emergency, please immediately call 911 or go to the emergency department.  Pager Numbers  - Dr. Nehemiah Massed: [phone]  - Dr. Laurence Ferrari: [phone]  - Dr. Nicole Kindred: [phone]  In the event of inclement weather, please call our main line at [phone] for an update on the status of any delays or closures.  Dermatology Medication Tips: Please keep the boxes that topical medications come in in order to help keep track of the instructions about where and how to use these. Pharmacies typically print the medication instructions only on the boxes and not directly on the medication tubes.   If your medication is too expensive, please contact our office at [phone] option 4 or send [place] a message through [place].   We are unable to tell what your co-pay for medications will be in advance as  this is different depending on your insurance coverage. However, we may be able to find a substitute medication at lower cost or fill out paperwork to get insurance to cover a needed medication.   If a prior authorization is required to get your medication covered by your insurance company, please allow [place] 1-2 business days to complete this process.  Drug prices often vary depending on where the prescription is filled and some pharmacies may offer cheaper prices.  The website www.goodrx.com contains coupons for medications through different pharmacies. The prices here do not account for what the cost may be with help from insurance (it may be cheaper with your insurance), but the website can give you the price if you did not use any insurance.  - You can print the associated coupon and take it with your prescription to the pharmacy.  - You may also stop by our office during regular business hours and pick up a GoodRx coupon card.  - If you need your prescription sent electronically to a  different pharmacy, notify our office through [facility] or by phone at [phone] option 4.     Si Usted Necesita Algo Despus de Su Visita  Tambin puede enviarnos un mensaje a travs de Pharmacist, community. Por lo general respondemos a los mensajes de MyChart en el transcurso de 1 a 2 das hbiles.  Para renovar recetas, por favor pida a su farmacia que se ponga en contacto con nuestra oficina. Harland Dingwall de fax es [place] [phone].  Si tiene un asunto urgente cuando la clnica est cerrada y que no puede esperar hasta el siguiente da hbil, puede llamar/localizar a su doctor(a) al nmero que aparece a continuacin.   Por favor, tenga en cuenta que aunque hacemos todo lo posible para estar disponibles para asuntos urgentes fuera del horario de [place], no estamos disponibles las 24 horas del da, los 7 das de la [place].   Si tiene un problema urgente y no puede comunicarse con nosotros, puede optar por buscar atencin mdica  en el consultorio de su doctor(a), en una clnica privada, en un centro de atencin urgente o en una sala de emergencias.  Si tiene Engineering geologist, por favor llame inmediatamente al 911 o vaya a la sala de emergencias.  Nmeros de bper  - Dr. Nehemiah Massed: [phone]  - Dra. [name]: [phone]  - Dra. Nicole Kindred: [phone]  En caso de inclemencias del [place], por favor llame a Johnsie Kindred principal al [phone] para una actualizacin sobre el [place] de cualquier retraso o cierre.  Consejos para la medicacin en dermatologa: Por favor, guarde las cajas en las que vienen los medicamentos de uso tpico para ayudarle a seguir las instrucciones sobre dnde y cmo usarlos. Las farmacias generalmente imprimen las instrucciones del medicamento slo en las cajas y no directamente en los tubos del [place].   Si su medicamento es muy caro, por favor, pngase en contacto con Zigmund Daniel llamando al [phone] y presione la opcin 4 o envenos un  mensaje a travs de Pharmacist, community.   No podemos decirle cul ser su copago por los medicamentos por adelantado ya que esto es diferente dependiendo de la cobertura de su seguro. Sin embargo, es posible que podamos encontrar un medicamento sustituto a Electrical engineer un formulario para que el seguro cubra el medicamento que se considera necesario.   Si se requiere una autorizacin previa para que su compaa de seguros [place] su medicamento, por favor permtanos de 1 a 2 das hbiles  para Education officer, community.  Los precios de los medicamentos varan con frecuencia dependiendo del Environmental consultant de dnde se surte la receta y alguna farmacias pueden ofrecer precios ms baratos.  El sitio web www.goodrx.com tiene cupones para medicamentos de Airline pilot. Los precios aqu no tienen en cuenta lo que podra costar con la ayuda del seguro (puede ser ms barato con su seguro), pero el sitio web puede darle el precio si no utiliz Research scientist (physical sciences).  - Puede imprimir el cupn correspondiente y llevarlo con su receta a la farmacia.  - Tambin puede pasar por nuestra oficina durante el horario de atencin regular y Charity fundraiser una tarjeta de cupones de GoodRx.  - Si necesita que su receta se enve electrnicamente a una farmacia diferente, informe a nuestra oficina a travs de MyChart de [place] o por telfono llamando al [phone] y presione la opcin 4.

## 2022-08-19 NOTE — Progress Notes (Signed)
   Follow-Up Visit   Subjective  Shelley Sims is a 77 y.o. female who presents for the following: Spot  The patient has spots, moles and lesions to be evaluated, some may be new or changing and the patient has concerns that these could be cancer.  Patient with a spot at right calf that is rough and she has never noticed it before until about 2 weeks ago. Patient normally sees Dr. Nehemiah Massed for psoriasis and TBSE.    The following portions of the chart were reviewed this encounter and updated as appropriate: medications, allergies, medical history  Review of Systems:  No other skin or systemic complaints except as noted in HPI or Assessment and Plan.  Objective  Well appearing patient in no apparent distress; mood and affect are within normal limits.  A focused examination was performed of the following areas: legs  Relevant physical exam findings are noted in the Assessment and Plan.  Assessment & Plan   SEBORRHEIC KERATOSIS - Stuck-on, waxy, tan-brown papules and/or plaques  - Benign-appearing - Discussed benign etiology and prognosis. - Observe - Call for any changes - Recommend OTC ammonium lactate cream   FACIAL ELASTOSIS Exam: Rhytides and volume loss.  Treatment Plan: Recommend Alastin Restorative Skin Complex twice daily.  Will prescribe [facility] Anti-Aging Tretinoin 0.0125%/Niacinamide/Vitamin C/Vitamin E/Turmeric/Resveratrol with Hyaluronic Acid. Apply pea sized amount nightly to the entire face.  The patient was advised this is not covered by insurance since it is made by a compounding pharmacy. They will receive an email to check out and the medication will be mailed to their home.   Topical retinoid medications like tretinoin can cause dryness and irritation when first started. Only apply a pea-sized amount to the entire affected area. Avoid applying it around the eyes, edges of mouth and creases at the nose. If you experience irritation, use a good  moisturizer first and/or apply the medicine less often. If you are doing well with the medicine, you can increase how often you use it until you are applying every night. Be careful with sun protection while using this medication as it can make you sensitive to the sun. This medicine should not be used by pregnant women.   ACTINIC DAMAGE - chronic, secondary to cumulative UV radiation exposure/sun exposure over time - diffuse scaly erythematous macules with underlying dyspigmentation - Recommend daily broad spectrum sunscreen SPF 30+ to sun-exposed areas, reapply every 2 hours as needed.  - Recommend staying in the shade or wearing long sleeves, sun glasses (UVA+UVB protection) and wide brim hats (4-inch brim around the entire circumference of the hat). - Call for new or changing lesions.  Return for as scheduled.  Graciella Belton, RMA, am acting as scribe for Forest Gleason, MD .   Documentation: I have reviewed the above documentation for accuracy and completeness, and I agree with the above.  Forest Gleason, MD

## 2022-08-25 DIAGNOSIS — M19012 Primary osteoarthritis, left shoulder: Secondary | ICD-10-CM | POA: Diagnosis not present

## 2022-09-29 DIAGNOSIS — M1711 Unilateral primary osteoarthritis, right knee: Secondary | ICD-10-CM | POA: Diagnosis not present

## 2022-10-07 DIAGNOSIS — I10 Essential (primary) hypertension: Secondary | ICD-10-CM | POA: Diagnosis not present

## 2022-10-07 DIAGNOSIS — R5383 Other fatigue: Secondary | ICD-10-CM | POA: Diagnosis not present

## 2022-10-07 DIAGNOSIS — R5381 Other malaise: Secondary | ICD-10-CM | POA: Diagnosis not present

## 2022-10-07 DIAGNOSIS — R0683 Snoring: Secondary | ICD-10-CM | POA: Diagnosis not present

## 2022-10-07 DIAGNOSIS — Z0000 Encounter for general adult medical examination without abnormal findings: Secondary | ICD-10-CM | POA: Diagnosis not present

## 2022-10-07 DIAGNOSIS — F419 Anxiety disorder, unspecified: Secondary | ICD-10-CM | POA: Diagnosis not present

## 2022-10-07 DIAGNOSIS — R0681 Apnea, not elsewhere classified: Secondary | ICD-10-CM | POA: Diagnosis not present

## 2022-10-07 DIAGNOSIS — I499 Cardiac arrhythmia, unspecified: Secondary | ICD-10-CM | POA: Diagnosis not present

## 2022-10-07 DIAGNOSIS — R400 Somnolence: Secondary | ICD-10-CM | POA: Diagnosis not present

## 2022-10-29 DIAGNOSIS — M25512 Pain in left shoulder: Secondary | ICD-10-CM | POA: Insufficient documentation

## 2022-10-30 DIAGNOSIS — M25512 Pain in left shoulder: Secondary | ICD-10-CM | POA: Diagnosis not present

## 2022-11-21 ENCOUNTER — Other Ambulatory Visit: Payer: Self-pay | Admitting: Cardiology

## 2022-11-21 DIAGNOSIS — R0602 Shortness of breath: Secondary | ICD-10-CM | POA: Insufficient documentation

## 2022-11-21 DIAGNOSIS — I471 Supraventricular tachycardia, unspecified: Secondary | ICD-10-CM

## 2022-11-21 DIAGNOSIS — I10 Essential (primary) hypertension: Secondary | ICD-10-CM | POA: Diagnosis not present

## 2022-11-24 ENCOUNTER — Ambulatory Visit
Admission: RE | Admit: 2022-11-24 | Discharge: 2022-11-24 | Disposition: A | Discharge status: Home / Self Care | Payer: Self-pay | Source: Ambulatory Visit | Attending: Cardiology | Admitting: Cardiology

## 2022-11-24 DIAGNOSIS — I471 Supraventricular tachycardia, unspecified: Secondary | ICD-10-CM | POA: Insufficient documentation

## 2022-11-24 DIAGNOSIS — R0602 Shortness of breath: Secondary | ICD-10-CM | POA: Insufficient documentation

## 2022-12-02 DIAGNOSIS — R0602 Shortness of breath: Secondary | ICD-10-CM | POA: Diagnosis not present

## 2022-12-13 DIAGNOSIS — G4733 Obstructive sleep apnea (adult) (pediatric): Secondary | ICD-10-CM | POA: Diagnosis not present

## 2022-12-17 DIAGNOSIS — I10 Essential (primary) hypertension: Secondary | ICD-10-CM | POA: Diagnosis not present

## 2022-12-17 DIAGNOSIS — R0602 Shortness of breath: Secondary | ICD-10-CM | POA: Diagnosis not present

## 2023-01-15 DIAGNOSIS — Z1231 Encounter for screening mammogram for malignant neoplasm of breast: Secondary | ICD-10-CM | POA: Diagnosis not present

## 2023-01-15 DIAGNOSIS — M8588 Other specified disorders of bone density and structure, other site: Secondary | ICD-10-CM | POA: Diagnosis not present

## 2023-01-15 DIAGNOSIS — Z8262 Family history of osteoporosis: Secondary | ICD-10-CM | POA: Diagnosis not present

## 2023-01-23 DIAGNOSIS — M1711 Unilateral primary osteoarthritis, right knee: Secondary | ICD-10-CM | POA: Diagnosis not present

## 2023-02-05 ENCOUNTER — Encounter: Payer: Self-pay | Admitting: Dermatology

## 2023-02-05 ENCOUNTER — Ambulatory Visit: Payer: PPO | Admitting: Dermatology

## 2023-02-05 VITALS — BP 140/82

## 2023-02-05 DIAGNOSIS — L820 Inflamed seborrheic keratosis: Secondary | ICD-10-CM | POA: Diagnosis not present

## 2023-02-05 DIAGNOSIS — L814 Other melanin hyperpigmentation: Secondary | ICD-10-CM

## 2023-02-05 DIAGNOSIS — D229 Melanocytic nevi, unspecified: Secondary | ICD-10-CM

## 2023-02-05 DIAGNOSIS — Z85828 Personal history of other malignant neoplasm of skin: Secondary | ICD-10-CM | POA: Diagnosis not present

## 2023-02-05 DIAGNOSIS — Z1283 Encounter for screening for malignant neoplasm of skin: Secondary | ICD-10-CM

## 2023-02-05 DIAGNOSIS — L409 Psoriasis, unspecified: Secondary | ICD-10-CM | POA: Diagnosis not present

## 2023-02-05 DIAGNOSIS — Z86018 Personal history of other benign neoplasm: Secondary | ICD-10-CM

## 2023-02-05 DIAGNOSIS — Z79899 Other long term (current) drug therapy: Secondary | ICD-10-CM | POA: Diagnosis not present

## 2023-02-05 DIAGNOSIS — L570 Actinic keratosis: Secondary | ICD-10-CM

## 2023-02-05 DIAGNOSIS — D1801 Hemangioma of skin and subcutaneous tissue: Secondary | ICD-10-CM | POA: Diagnosis not present

## 2023-02-05 DIAGNOSIS — L578 Other skin changes due to chronic exposure to nonionizing radiation: Secondary | ICD-10-CM

## 2023-02-05 DIAGNOSIS — L821 Other seborrheic keratosis: Secondary | ICD-10-CM

## 2023-02-05 DIAGNOSIS — Z7189 Other specified counseling: Secondary | ICD-10-CM

## 2023-02-05 DIAGNOSIS — Z872 Personal history of diseases of the skin and subcutaneous tissue: Secondary | ICD-10-CM

## 2023-02-05 DIAGNOSIS — W908XXA Exposure to other nonionizing radiation, initial encounter: Secondary | ICD-10-CM | POA: Diagnosis not present

## 2023-02-05 DIAGNOSIS — D692 Other nonthrombocytopenic purpura: Secondary | ICD-10-CM

## 2023-02-05 MED ORDER — MOMETASONE FUROATE 0.1 % EX CREA: 1.0000 | TOPICAL_CREAM | CUTANEOUS | 3 refills | Status: AC

## 2023-02-05 NOTE — Patient Instructions (Addendum)
Dermend Bruise formula for bruising on arms  Cryotherapy Aftercare  Wash gently with soap and water everyday.   Apply Vaseline and Band-Aid daily until healed.   Due to recent changes in healthcare laws, you may see results of your pathology and/or laboratory studies on MyChart before the doctors have had a chance to review them. We understand that in some cases there may be results that are confusing or concerning to you. Please understand that not all results are received at the same time and often the doctors may need to interpret multiple results in order to provide you with the best plan of care or course of treatment. Therefore, we ask that you please give [place] 2 business days to thoroughly review all your results before contacting the office for clarification. Should we see a critical lab result, you will be contacted sooner.   If You Need Anything After Your Visit  If you have any questions or concerns for your doctor, please call our main line at [phone] and press option 4 to reach your doctor's medical assistant. If no one answers, please leave a voicemail as directed and we will return your call as soon as possible. Messages left after 4 pm will be answered the following business day.   You may also send [place] a message via MyChart. We typically respond to MyChart messages within 1-2 business days.  For prescription refills, please ask your pharmacy to contact our office. Our fax number is [phone].  If you have an urgent issue when the clinic is closed that cannot wait until the next business day, you can page your doctor at the number below.    Please note that while we do our best to be available for urgent issues outside of office hours, we are not available 24/7.   If you have an urgent issue and are unable to reach [place], you may choose to seek medical care at your doctor's office, retail clinic, urgent care center, or emergency room.  If you have a medical emergency, please  immediately call 911 or go to the emergency department.  Pager Numbers  - Dr. Gwen Pounds: [phone]  - Dr. Roseanne Reno: [phone]  - Dr. Katrinka Blazing: [phone]   In the event of inclement weather, please call our main line at [phone] for an update on the status of any delays or closures.  Dermatology Medication Tips: Please keep the boxes that topical medications come in in order to help keep track of the instructions about where and how to use these. Pharmacies typically print the medication instructions only on the boxes and not directly on the medication tubes.   If your medication is too expensive, please contact our office at [phone] option 4 or send [place] a message through MyChart.   We are unable to tell what your co-pay for medications will be in advance as this is different depending on your insurance coverage. However, we may be able to find a substitute medication at lower cost or fill out paperwork to get insurance to cover a needed medication.   If a prior authorization is required to get your medication covered by your insurance company, please allow [place] 1-2 business days to complete this process.  Drug prices often vary depending on where the prescription is filled and some pharmacies may offer cheaper prices.  The website www.goodrx.com contains coupons for medications through different pharmacies. The prices here do not account for what the cost may be with help from insurance (it may be cheaper  with your insurance), but the website can give you the price if you did not use any insurance.  - You can print the associated coupon and take it with your prescription to the pharmacy.  - You may also stop by our office during regular business hours and pick up a GoodRx coupon card.  - If you need your prescription sent electronically to a different pharmacy, notify our office through [facility] or by phone at [phone] option 4.     Si Usted Necesita Algo  Despus de Su Visita  Tambin puede enviarnos un mensaje a travs de Clinical cytogeneticist. Por lo general respondemos a los mensajes de MyChart en el transcurso de 1 a 2 das hbiles.  Para renovar recetas, por favor pida a su farmacia que se ponga en contacto con nuestra oficina. Annie Sable de fax es [place] [phone].  Si tiene un asunto urgente cuando la clnica est cerrada y que no puede esperar hasta el siguiente da hbil, puede llamar/localizar a su doctor(a) al nmero que aparece a continuacin.   Por favor, tenga en cuenta que aunque hacemos todo lo posible para estar disponibles para asuntos urgentes fuera del horario de [place], no estamos disponibles las 24 horas del da, los 7 [address]  [address] de la [place].   Si tiene un problema urgente y no puede comunicarse con nosotros, puede optar por buscar atencin mdica  en el consultorio de su doctor(a), en una clnica privada, en un centro de atencin urgente o en una sala de emergencias.  Si tiene Engineer, drilling, por favor llame inmediatamente al 911 o vaya a la sala de emergencias.  Nmeros de bper  - Dr. Gwen Pounds: [phone]  - Dra. Roseanne Reno: [phone]  - Dr. Katrinka Blazing: [phone]   En caso de inclemencias del tiempo, por favor llame a Lacy Duverney principal al [phone] para una actualizacin sobre el [place] de cualquier retraso o cierre.  Consejos para la medicacin en dermatologa: Por favor, guarde las cajas en las que vienen los medicamentos de uso tpico para ayudarle a seguir las instrucciones sobre dnde y cmo usarlos. Las farmacias generalmente imprimen las instrucciones del medicamento slo en las cajas y no directamente en los tubos del [place].   Si su medicamento es muy caro, por favor, pngase en contacto con Rolm Gala llamando al [phone] y presione la opcin 4 o envenos un mensaje a travs de Clinical cytogeneticist.   No podemos decirle cul ser su copago por los medicamentos por adelantado ya que esto es diferente  dependiendo de la cobertura de su seguro. Sin embargo, es posible que podamos encontrar un medicamento sustituto a Audiological scientist un formulario para que el seguro cubra el medicamento que se considera necesario.   Si se requiere una autorizacin previa para que su compaa de seguros [place] su medicamento, por favor permtanos de 1 a 2 das hbiles para completar [address]  Los precios de los medicamentos varan con frecuencia dependiendo del Environmental consultant de dnde se surte la receta y alguna farmacias pueden ofrecer precios ms baratos.  El sitio web www.goodrx.com tiene cupones para medicamentos de Health and safety inspector. Los precios aqu no tienen en cuenta lo que podra costar con la ayuda del seguro (puede ser ms barato con su seguro), pero el sitio web puede darle el precio si no utiliz Tourist information centre manager.  - Puede imprimir el cupn correspondiente y llevarlo con su receta a la farmacia.  - Tambin puede pasar por nuestra oficina durante el horario de atencin regular y Education officer, museum  una tarjeta de cupones de GoodRx.  - Si necesita que su receta se enve electrnicamente a una farmacia diferente, informe a nuestra oficina a travs de MyChart de [place] o por telfono llamando al [phone] y presione la opcin 4.

## 2023-02-05 NOTE — Progress Notes (Signed)
Follow-Up Visit   Subjective  Shelley Sims is a 77 y.o. female who presents for the following: Skin Cancer Screening and Full Body Skin Exam, hx of BCC L eye, hx of Dysplastic Nevus R low back 4.0cm lat to spine, hx of AKs Psoriasis scalp, ears, Mometasone cr prn, check dry scaly spot, R forehead, 12m  The patient presents for Total-Body Skin Exam (TBSE) for skin cancer screening and mole check. The patient has spots, moles and lesions to be evaluated, some may be new or changing and the patient may have concern these could be cancer.  The following portions of the chart were reviewed this encounter and updated as appropriate: medications, allergies, medical history  Review of Systems:  No other skin or systemic complaints except as noted in HPI or Assessment and Plan.  Objective  Well appearing patient in no apparent distress; mood and affect are within normal limits.  A full examination was performed including scalp, head, eyes, ears, nose, lips, neck, chest, axillae, abdomen, back, buttocks, bilateral upper extremities, bilateral lower extremities, hands, feet, fingers, toes, fingernails, and toenails. All findings within normal limits unless otherwise noted below.   Relevant physical exam findings are noted in the Assessment and Plan.  face x 2 (2) Pink scaly macules  L ant shoulder x 1, L ant thigh x 1 (2) Stuck on waxy paps with erythema    Assessment & Plan   SKIN CANCER SCREENING PERFORMED TODAY.  ACTINIC DAMAGE - Chronic condition, secondary to cumulative UV/sun exposure - diffuse scaly erythematous macules with underlying dyspigmentation - Recommend daily broad spectrum sunscreen SPF 30+ to sun-exposed areas, reapply every 2 hours as needed.  - Staying in the shade or wearing long sleeves, sun glasses (UVA+UVB protection) and wide brim hats (4-inch brim around the entire circumference of the hat) are also recommended for sun protection.  - Call for new or changing  lesions.  LENTIGINES, SEBORRHEIC KERATOSES, HEMANGIOMAS - Benign normal skin lesions - Benign-appearing - Call for any changes  MELANOCYTIC NEVI - Tan-brown and/or pink-flesh-colored symmetric macules and papules - Benign appearing on exam today - Observation - Call clinic for new or changing moles - Recommend daily use of broad spectrum spf 30+ sunscreen to sun-exposed areas.   HISTORY OF BASAL CELL CARCINOMA OF THE SKIN - No evidence of recurrence today - Recommend regular full body skin exams - Recommend daily broad spectrum sunscreen SPF 30+ to sun-exposed areas, reapply every 2 hours as needed.  - Call if any new or changing lesions are noted between office visits  - L eye mohs 03/28/2021  HISTORY OF DYSPLASTIC NEVUS No evidence of recurrence today Recommend regular full body skin exams Recommend daily broad spectrum sunscreen SPF 30+ to sun-exposed areas, reapply every 2 hours as needed.  Call if any new or changing lesions are noted between office visits  - R low back 4.0cm lat to spine  PSORIASIS Scalp, ears Exam:  1% BSA. Chronic condition with duration or expected duration over one year. Currently well-controlled.   patient denies joint pain  Psoriasis is a chronic non-curable, but treatable genetic/hereditary disease that may have other systemic features affecting other organ systems such as joints (Psoriatic Arthritis). It is associated with an increased risk of inflammatory bowel disease, heart disease, non-alcoholic fatty liver disease, and depression.  Treatments include light and laser treatments; topical medications; and systemic medications including oral and injectables.  Treatment Plan: Cont Mometasone cr qd up to 5d/wk aa scalp, ears prn  flares  Topical steroids (such as triamcinolone, fluocinolone, fluocinonide, mometasone, clobetasol, halobetasol, betamethasone, hydrocortisone) can cause thinning and lightening of the skin if they are used for too long  in the same area. Your physician has selected the right strength medicine for your problem and area affected on the body. Please use your medication only as directed by your physician to prevent side effects.     Long term medication management.  Patient is using long term (months to years) prescription medication  to control their dermatologic condition.  These medications require periodic monitoring to evaluate for efficacy and side effects and may require periodic laboratory monitoring.   AK (actinic keratosis) (2) face x 2  Actinic keratoses are precancerous spots that appear secondary to cumulative UV radiation exposure/sun exposure over time. They are chronic with expected duration over 1 year. A portion of actinic keratoses will progress to squamous cell carcinoma of the skin. It is not possible to reliably predict which spots will progress to skin cancer and so treatment is recommended to prevent development of skin cancer.  Recommend daily broad spectrum sunscreen SPF 30+ to sun-exposed areas, reapply every 2 hours as needed.  Recommend staying in the shade or wearing long sleeves, sun glasses (UVA+UVB protection) and wide brim hats (4-inch brim around the entire circumference of the hat). Call for new or changing lesions.  Destruction of lesion - face x 2 (2) Complexity: simple   Destruction method: cryotherapy   Informed consent: discussed and consent obtained   Timeout:  patient name, date of birth, surgical site, and procedure verified Lesion destroyed using liquid nitrogen: Yes   Region frozen until ice ball extended beyond lesion: Yes   Outcome: patient tolerated procedure well with no complications   Post-procedure details: wound care instructions given    Inflamed seborrheic keratosis (2) L ant shoulder x 1, L ant thigh x 1  Symptomatic, irritating, patient would like treated.  Destruction of lesion - L ant shoulder x 1, L ant thigh x 1 (2) Complexity: simple    Destruction method: cryotherapy   Informed consent: discussed and consent obtained   Timeout:  patient name, date of birth, surgical site, and procedure verified Lesion destroyed using liquid nitrogen: Yes   Region frozen until ice ball extended beyond lesion: Yes   Outcome: patient tolerated procedure well with no complications   Post-procedure details: wound care instructions given    Actinic skin damage  Lentigo  Melanocytic nevus, unspecified location  Purpura (HCC)  Skin cancer screening  History of basal cell carcinoma  History of dysplastic nevus  Psoriasis  Counseling and coordination of care  Medication management  Purpura - Chronic; persistent and recurrent.  Treatable, but not curable. - Violaceous macules and patches - Benign - Related to trauma, age, sun damage and/or use of blood thinners, chronic use of topical and/or oral steroids - Observe - Can use OTC arnica containing moisturizer such as Dermend Bruise Formula if desired - Call for worsening or other concerns   Return in about 1 year (around 02/05/2024) for TBSE, Hx of BCC, Hx of Dysplastic nevi, Hx of AKs.  I, Ardis Rowan, RMA, am acting as scribe for Armida Sans, MD .  Documentation: I have reviewed the above documentation for accuracy and completeness, and I agree with the above.  Armida Sans, MD

## 2023-02-07 ENCOUNTER — Encounter: Payer: Self-pay | Admitting: Dermatology

## 2023-02-24 DIAGNOSIS — G4733 Obstructive sleep apnea (adult) (pediatric): Secondary | ICD-10-CM | POA: Diagnosis not present

## 2023-02-24 DIAGNOSIS — I10 Essential (primary) hypertension: Secondary | ICD-10-CM | POA: Diagnosis not present

## 2023-02-24 DIAGNOSIS — Z0000 Encounter for general adult medical examination without abnormal findings: Secondary | ICD-10-CM | POA: Diagnosis not present

## 2023-02-24 DIAGNOSIS — M85851 Other specified disorders of bone density and structure, right thigh: Secondary | ICD-10-CM | POA: Diagnosis not present

## 2023-03-19 DIAGNOSIS — E041 Nontoxic single thyroid nodule: Secondary | ICD-10-CM | POA: Diagnosis not present

## 2023-03-19 DIAGNOSIS — G4733 Obstructive sleep apnea (adult) (pediatric): Secondary | ICD-10-CM | POA: Diagnosis not present

## 2023-03-27 DIAGNOSIS — G4733 Obstructive sleep apnea (adult) (pediatric): Secondary | ICD-10-CM | POA: Diagnosis not present

## 2023-04-02 DIAGNOSIS — G4733 Obstructive sleep apnea (adult) (pediatric): Secondary | ICD-10-CM | POA: Diagnosis not present

## 2023-04-02 DIAGNOSIS — E041 Nontoxic single thyroid nodule: Secondary | ICD-10-CM | POA: Diagnosis not present

## 2023-04-21 DIAGNOSIS — I471 Supraventricular tachycardia, unspecified: Secondary | ICD-10-CM | POA: Insufficient documentation

## 2023-04-21 DIAGNOSIS — G4733 Obstructive sleep apnea (adult) (pediatric): Secondary | ICD-10-CM | POA: Diagnosis not present

## 2023-04-21 DIAGNOSIS — I10 Essential (primary) hypertension: Secondary | ICD-10-CM | POA: Diagnosis not present

## 2023-04-21 DIAGNOSIS — R0602 Shortness of breath: Secondary | ICD-10-CM | POA: Diagnosis not present

## 2023-04-28 DIAGNOSIS — M5431 Sciatica, right side: Secondary | ICD-10-CM | POA: Diagnosis not present

## 2023-04-28 DIAGNOSIS — G4733 Obstructive sleep apnea (adult) (pediatric): Secondary | ICD-10-CM | POA: Diagnosis not present

## 2023-04-28 DIAGNOSIS — I10 Essential (primary) hypertension: Secondary | ICD-10-CM | POA: Diagnosis not present

## 2023-04-28 DIAGNOSIS — M8580 Other specified disorders of bone density and structure, unspecified site: Secondary | ICD-10-CM | POA: Diagnosis not present

## 2023-04-30 DIAGNOSIS — G4733 Obstructive sleep apnea (adult) (pediatric): Secondary | ICD-10-CM | POA: Diagnosis not present

## 2023-05-10 ENCOUNTER — Encounter: Payer: Self-pay | Admitting: *Deleted

## 2023-05-10 ENCOUNTER — Ambulatory Visit
Admission: EM | Admit: 2023-05-10 | Discharge: 2023-05-10 | Disposition: A | Discharge status: Home / Self Care | Payer: PPO | Attending: Emergency Medicine | Admitting: Emergency Medicine

## 2023-05-10 ENCOUNTER — Inpatient Hospital Stay: Admission: RE | Admit: 2023-05-10 | Payer: PPO | Source: Ambulatory Visit

## 2023-05-10 DIAGNOSIS — J209 Acute bronchitis, unspecified: Secondary | ICD-10-CM

## 2023-05-10 DIAGNOSIS — J069 Acute upper respiratory infection, unspecified: Secondary | ICD-10-CM

## 2023-05-10 HISTORY — DX: Sleep apnea, unspecified: G47.30

## 2023-05-10 MED ORDER — BENZONATATE 100 MG PO CAPS: 100.0000 mg | ORAL_CAPSULE | Freq: Three times a day (TID) | ORAL | 0 refills | Status: DC | PRN

## 2023-05-10 MED ORDER — AZITHROMYCIN 250 MG PO TABS: 250.0000 mg | ORAL_TABLET | Freq: Every day | ORAL | 0 refills | Status: DC

## 2023-05-10 NOTE — Discharge Instructions (Addendum)
Continue using your albuterol inhaler as directed.  Take the Zithromax and Tessalon Perles as directed.  Follow-up with your primary care provider if your symptoms are not improving.

## 2023-05-10 NOTE — ED Triage Notes (Signed)
Patient states 5 days of cough/congestion, concerned that she has bronchitis and needs Abx.  Taking OTC Mucinex, Robitussin, Albuterol inhaler with little relief

## 2023-05-10 NOTE — ED Provider Notes (Signed)
Shelley Sims    CSN: [id number] Arrival date & time: 05/10/23  3086      History   Chief Complaint Chief Complaint  Patient presents with   Cough    HPI Shelley Sims is a 77 y.o. female.  Patient presents with 5-day history of congestion and cough.  OTC treatment attempted without relief.  Patient has an albuterol inhaler that was previously prescribed and she has used this also.  No fever, chest pain, shortness of breath.  Her medical history includes hypertension, sleep apnea, GERD.  The history is provided by the patient and medical records.    Past Medical History:  Diagnosis Date   Actinic keratosis    Atypical mole 09/03/2017   right low back 4.0cm lat to spine   BCC (basal cell carcinoma of skin)    L eye, pt is scheduled for [facility] with Dr. Lorn Junes 03/28/21   History of basal cell carcinoma (BCC) 02/27/2020   right nasal sidewall   Hypertension    Sleep apnea     Patient Active Problem List   Diagnosis Date Noted   Encounter for screening colonoscopy    Polyp of transverse colon    Gastroesophageal reflux disease    Gastritis without bleeding    Stiffness of finger joint of left hand 09/22/2019   Encounter for orthopedic follow-up care 09/08/2019   Pain of left hand 06/02/2019   Trigger thumb of left hand 03/15/2019   Hypertension 03/07/2019   Menopause 03/07/2019   Osteopenia 03/07/2019   Acute pain of right knee 10/12/2018   Osteoarthritis of left shoulder 11/17/2016   Pain of right sacroiliac joint 11/22/2014   Abnormality of gait 11/22/2014   Plantar fibromatosis 11/22/2014    Past Surgical History:  Procedure Laterality Date   COLONOSCOPY     COLONOSCOPY WITH PROPOFOL N/A 10/18/2019   Procedure: COLONOSCOPY WITH PROPOFOL;  Surgeon: [name], MD;  Location: [facility] ENDOSCOPY;  Service: Endoscopy;  Laterality: N/A;   ESOPHAGOGASTRODUODENOSCOPY (EGD) WITH PROPOFOL N/A 10/18/2019   Procedure: ESOPHAGOGASTRODUODENOSCOPY (EGD) WITH PROPOFOL;   Surgeon: [name], MD;  Location: [facility] ENDOSCOPY;  Service: Endoscopy;  Laterality: N/A;   FOOT SURGERY     HAND SURGERY      OB History     Gravida  2   Para  2   Term      Preterm      AB      Living         SAB      IAB      Ectopic      Multiple      Live Births               Home Medications    Prior to Admission medications   Medication Sig Start Date End Date Taking? Authorizing Provider  azithromycin (ZITHROMAX) 250 MG tablet Take 1 tablet (250 mg total) by mouth daily. Take first 2 tablets together, then 1 every day until finished. 05/10/23  Yes Mickie Bail, NP  benzonatate (TESSALON) 100 MG capsule Take 1 capsule (100 mg total) by mouth 3 (three) times daily as needed for cough. 05/10/23  Yes Mickie Bail, NP  ALPRAZolam Prudy Feeler) 0.5 MG tablet TAKE ONE-HALF TO ONE TABLET EVERY EIGHT HOURS AS NEEDED 07/13/14   [provider]  AMBULATORY NON FORMULARY MEDICATION Medication Name: Compounded Tretinoin 0.0125%, Niacinamide 2%, Hyalyronic Acid 0.25% Cream ([facility] [facility]) Apply pea sized amount nightly to the entire face as  directed . 08/19/22   [name], [name], MD  Calcium Carbonate-Vitamin D 600-200 MG-UNIT TABS Take by mouth.    [provider]  clobetasol cream (TEMOVATE) 0.05 % Apply to skin  twice daily as needed to affected areas for two weeks then apply twice daily on weekends only 09/13/20   Deirdre Evener, MD  Clobetasol Prop Emollient Base (CLOBETASOL PROPIONATE E) 0.05 % emollient cream Apply to skin  twice daily as needed to affected areas for two weeks then apply twice daily on weekends only 09/13/20   Deirdre Evener, MD  escitalopram (LEXAPRO) 10 MG tablet  08/28/21   [provider]  mometasone (ELOCON) 0.1 % cream Apply 1 Application topically as directed. qd up to 5 days a week to aa psoriasis scalp, ears prn flares 02/05/23   Deirdre Evener, MD  nitroGLYCERIN (NITRODUR - DOSED IN MG/24 HR) 0.2 mg/hr patch USE  1/4 PATCH FOR 24 HOURS TO THE AFFECTED AREA 10/12/18   Enid Baas, MD  olmesartan (BENICAR) 20 MG tablet Take 20 mg by mouth daily. 07/11/20   [provider]  omeprazole (PRILOSEC) 20 MG capsule  01/06/19   [provider]  TURMERIC PO Take by mouth.    [provider]  Zinc Sulfate (ZINC 15 PO) Take by mouth.    [provider]    Family History History reviewed. No pertinent family history.  Social History Social History   Tobacco Use   Smoking status: Never   Smokeless tobacco: Never  Vaping Use   Vaping status: Never Used  Substance Use Topics   Alcohol use: Yes   Drug use: Never     Allergies   Penicillins   Review of Systems Review of Systems  Constitutional:  Negative for chills and fever.  HENT:  Positive for congestion. Negative for ear pain and sore throat.   Respiratory:  Positive for cough. Negative for shortness of breath.   Cardiovascular:  Negative for chest pain and palpitations.     Physical Exam Triage Vital Signs ED Triage Vitals  Encounter Vitals Group     BP --      Systolic BP Percentile --      Diastolic BP Percentile --      Pulse Rate 05/10/23 0928 64     Resp 05/10/23 0928 18     Temp 05/10/23 0928 98 F (36.7 C)     Temp src --      SpO2 05/10/23 0928 95 %     Weight 05/10/23 0930 147 lb (66.7 kg)     Height 05/10/23 0930 5\' 8"  (1.727 m)     Head Circumference --      Peak Flow --      Pain Score 05/10/23 0930 0     Pain Loc --      Pain Education --      Exclude from Growth Chart --    No data found.  Updated Vital Signs BP 114/69 (BP Location: Right Arm)   Pulse 64   Temp 98 F (36.7 C)   Resp 18   Ht 5\' 8"  (1.727 m)   Wt 147 lb (66.7 kg)   SpO2 95%   BMI 22.35 kg/m   Visual Acuity Right Eye Distance:   Left Eye Distance:   Bilateral Distance:    Right Eye Near:   Left Eye Near:    Bilateral Near:     Physical Exam Vitals and nursing note reviewed.  Constitutional:  General: She is not in acute distress.    Appearance: She is well-developed.  HENT:     Right Ear: Tympanic membrane normal.     Left Ear: Tympanic membrane normal.     Nose: Rhinorrhea present.     Mouth/Throat:     Mouth: Mucous membranes are moist.     Pharynx: Oropharynx is clear.  Cardiovascular:     Rate and Rhythm: Normal rate and regular rhythm.     Heart sounds: Normal heart sounds.  Pulmonary:     Effort: Pulmonary effort is normal. No respiratory distress.     Breath sounds: Normal breath sounds.     Comments: Frequent dry cough. Musculoskeletal:     Cervical back: Neck supple.  Skin:    General: Skin is warm and dry.  Neurological:     Mental Status: She is alert.      UC Treatments / Results  Labs (all labs ordered are listed, but only abnormal results are displayed) Labs Reviewed - No data to display  EKG   Radiology No results found.  Procedures Procedures (including critical care time)  Medications Ordered in UC Medications - No data to display  Initial Impression / Assessment and Plan / UC Course  I have reviewed the triage vital signs and the nursing notes.  Pertinent labs & imaging results that were available during my care of the patient were reviewed by me and considered in my medical decision making (see chart for details).    Acute URI, acute bronchitis.  Afebrile and vital signs are stable.  Patient has been symptomatic for 5 days and is not improving with OTC treatment.  Treating today with Zithromax and Tessalon Perles.  Patient has an albuterol inhaler that she says is in date and has plenty of activations.  Instructed her to follow-up with her PCP if she is not improving.  She agrees to plan of care.  Final Clinical Impressions(s) / UC Diagnoses   Final diagnoses:  Acute upper respiratory infection  Acute bronchitis, unspecified organism     Discharge Instructions      Continue using your albuterol inhaler as directed.  Take  the Zithromax and Tessalon Perles as directed.  Follow-up with your primary care provider if your symptoms are not improving.      ED Prescriptions     Medication Sig Dispense Auth. Provider   azithromycin (ZITHROMAX) 250 MG tablet Take 1 tablet (250 mg total) by mouth daily. Take first 2 tablets together, then 1 every day until finished. 6 tablet Mickie Bail, NP   benzonatate (TESSALON) 100 MG capsule Take 1 capsule (100 mg total) by mouth 3 (three) times daily as needed for cough. 21 capsule Mickie Bail, NP      PDMP not reviewed this encounter.   Mickie Bail, NP 05/10/23 [phone]

## 2023-05-13 DIAGNOSIS — J209 Acute bronchitis, unspecified: Secondary | ICD-10-CM | POA: Diagnosis not present

## 2023-05-31 DIAGNOSIS — G4733 Obstructive sleep apnea (adult) (pediatric): Secondary | ICD-10-CM | POA: Diagnosis not present

## 2023-06-13 ENCOUNTER — Telehealth: Payer: Self-pay | Admitting: Emergency Medicine

## 2023-06-13 ENCOUNTER — Ambulatory Visit (INDEPENDENT_AMBULATORY_CARE_PROVIDER_SITE_OTHER): Payer: PPO

## 2023-06-13 ENCOUNTER — Other Ambulatory Visit: Payer: PPO

## 2023-06-13 ENCOUNTER — Ambulatory Visit
Admission: RE | Admit: 2023-06-13 | Discharge: 2023-06-13 | Disposition: A | Discharge status: Home / Self Care | Payer: Self-pay | Source: Ambulatory Visit | Attending: Emergency Medicine | Admitting: Emergency Medicine

## 2023-06-13 VITALS — BP 150/88 | HR 60 | Temp 98.5°F | Resp 18 | Ht 67.5 in | Wt 148.0 lb

## 2023-06-13 DIAGNOSIS — R051 Acute cough: Secondary | ICD-10-CM | POA: Diagnosis not present

## 2023-06-13 DIAGNOSIS — R058 Other specified cough: Secondary | ICD-10-CM | POA: Diagnosis not present

## 2023-06-13 MED ORDER — CEFDINIR 300 MG PO CAPS: 300.0000 mg | ORAL_CAPSULE | Freq: Two times a day (BID) | ORAL | 0 refills | Status: AC

## 2023-06-13 NOTE — ED Triage Notes (Signed)
Patient states recurrent cough/cold for about a month even after treatment.

## 2023-06-13 NOTE — Discharge Instructions (Addendum)
Your symptoms have persisted for  a month despite use of antibiotic, inhaler, steroids and cough medicine we have completed a chest x-ray  Results are pending and you will be notified of results via telephone  Had improvement with use of antibiotic therefore we will initiate that at this time  Begin cefdinir every morning and every evening for 7 days  Will make additional adjustments to your treatment plan based on x-ray results  You can take Tylenol and/or Ibuprofen as needed for fever reduction and pain relief.   For cough: honey 1/2 to 1 teaspoon (you can dilute the honey in water or another fluid).  You can also use guaifenesin and dextromethorphan for cough. You can use a humidifier for chest congestion and cough.  If you don't have a humidifier, you can sit in the bathroom with the hot shower running.      For sore throat: try warm salt water gargles, cepacol lozenges, throat spray, warm tea or water with lemon/honey, popsicles or ice, or OTC cold relief medicine for throat discomfort.   For congestion: take a daily anti-histamine like Zyrtec, Claritin, and a oral decongestant, such as pseudoephedrine.  You can also use Flonase 1-2 sprays in each nostril daily.   It is important to stay hydrated: drink plenty of fluids (water, gatorade/powerade/pedialyte, juices, or teas) to keep your throat moisturized and help further relieve irritation/discomfort.

## 2023-06-13 NOTE — ED Provider Notes (Signed)
Shelley Sims    CSN: [id number] Arrival date & time: 06/13/23  1017      History   Chief Complaint Chief Complaint  Patient presents with   Cough    I have had a cough for a month. - Entered by patient    HPI Shelley Sims is a 78 y.o. female.   Patient presents for evaluation of a nasal congestion and persisting nonproductive cough present for 1 month.  Was evaluated in this urgent care around 1215 and prescribed antibiotics, and then evaluated by PCP 3 days later and given steroids and Tussidex, endorses improvement on symptoms but did not fully resolve.  Had new exposure around Christmas which caused symptoms to flare, have been persistent since.  Denies shortness of breath or wheezing, fever, ear pain or sore throat.  Has inhaler available.  History of reoccurring bronchitis.  Past Medical History:  Diagnosis Date   Actinic keratosis    Atypical mole 09/03/2017   right low back 4.0cm lat to spine   BCC (basal cell carcinoma of skin)    L eye, pt is scheduled for [facility] with Dr. Lorn Junes 03/28/21   History of basal cell carcinoma (BCC) 02/27/2020   right nasal sidewall   Hypertension    Sleep apnea     Patient Active Problem List   Diagnosis Date Noted   Encounter for screening colonoscopy    Polyp of transverse colon    Gastroesophageal reflux disease    Gastritis without bleeding    Stiffness of finger joint of left hand 09/22/2019   Encounter for orthopedic follow-up care 09/08/2019   Pain of left hand 06/02/2019   Trigger thumb of left hand 03/15/2019   Hypertension 03/07/2019   Menopause 03/07/2019   Osteopenia 03/07/2019   Acute pain of right knee 10/12/2018   Osteoarthritis of left shoulder 11/17/2016   Pain of right sacroiliac joint 11/22/2014   Abnormality of gait 11/22/2014   Plantar fibromatosis 11/22/2014    Past Surgical History:  Procedure Laterality Date   COLONOSCOPY     COLONOSCOPY WITH PROPOFOL N/A 10/18/2019   Procedure: COLONOSCOPY  WITH PROPOFOL;  Surgeon: [name], MD;  Location: [facility] ENDOSCOPY;  Service: Endoscopy;  Laterality: N/A;   ESOPHAGOGASTRODUODENOSCOPY (EGD) WITH PROPOFOL N/A 10/18/2019   Procedure: ESOPHAGOGASTRODUODENOSCOPY (EGD) WITH PROPOFOL;  Surgeon: [name], MD;  Location: [facility] ENDOSCOPY;  Service: Endoscopy;  Laterality: N/A;   FOOT SURGERY     HAND SURGERY      OB History     Gravida  2   Para  2   Term      Preterm      AB      Living         SAB      IAB      Ectopic      Multiple      Live Births               Home Medications    Prior to Admission medications   Medication Sig Start Date End Date Taking? Authorizing Provider  cefdinir (OMNICEF) 300 MG capsule Take 1 capsule (300 mg total) by mouth 2 (two) times daily for 7 days. 06/13/23 06/20/23 Yes Sky Borboa, Elita Boone, NP  ALPRAZolam (XANAX) 0.5 MG tablet TAKE ONE-HALF TO ONE TABLET EVERY EIGHT HOURS AS NEEDED 07/13/14   [provider]  AMBULATORY NON FORMULARY MEDICATION Medication Name: Compounded Tretinoin 0.0125%, Niacinamide 2%, Hyalyronic Acid 0.25% Cream (Skin Medicinals) Apply pea  sized amount nightly to the entire face as directed . 08/19/22   [name], [name], MD  azithromycin (ZITHROMAX) 250 MG tablet Take 1 tablet (250 mg total) by mouth daily. Take first 2 tablets together, then 1 every day until finished. 05/10/23   Mickie Bail, NP  benzonatate (TESSALON) 100 MG capsule Take 1 capsule (100 mg total) by mouth 3 (three) times daily as needed for cough. 05/10/23   Mickie Bail, NP  Calcium Carbonate-Vitamin D 600-200 MG-UNIT TABS Take by mouth.    [provider]  clobetasol cream (TEMOVATE) 0.05 % Apply to skin  twice daily as needed to affected areas for two weeks then apply twice daily on weekends only 09/13/20   Deirdre Evener, MD  Clobetasol Prop Emollient Base (CLOBETASOL PROPIONATE E) 0.05 % emollient cream Apply to skin  twice daily as needed to affected areas for two weeks  then apply twice daily on weekends only 09/13/20   Deirdre Evener, MD  escitalopram (LEXAPRO) 10 MG tablet  08/28/21   [provider]  mometasone (ELOCON) 0.1 % cream Apply 1 Application topically as directed. qd up to 5 days a week to aa psoriasis scalp, ears prn flares 02/05/23   Deirdre Evener, MD  nitroGLYCERIN (NITRODUR - DOSED IN MG/24 HR) 0.2 mg/hr patch USE 1/4 PATCH FOR 24 HOURS TO THE AFFECTED AREA 10/12/18   Enid Baas, MD  olmesartan (BENICAR) 20 MG tablet Take 20 mg by mouth daily. 07/11/20   [provider]  omeprazole (PRILOSEC) 20 MG capsule  01/06/19   [provider]  TURMERIC PO Take by mouth.    [provider]  Zinc Sulfate (ZINC 15 PO) Take by mouth.    [provider]    Family History History reviewed. No pertinent family history.  Social History Social History   Tobacco Use   Smoking status: Never   Smokeless tobacco: Never  Vaping Use   Vaping status: Never Used  Substance Use Topics   Alcohol use: Yes   Drug use: Never     Allergies   Penicillins   Review of Systems Review of Systems   Physical Exam Triage Vital Signs ED Triage Vitals  Encounter Vitals Group     BP 06/13/23 1032 (!) 150/88     Systolic BP Percentile --      Diastolic BP Percentile --      Pulse Rate 06/13/23 1032 60     Resp 06/13/23 1032 18     Temp 06/13/23 1032 98.5 F (36.9 C)     Temp Source 06/13/23 1032 Oral     SpO2 06/13/23 1032 97 %     Weight 06/13/23 1029 148 lb (67.1 kg)     Height 06/13/23 1029 5' 7.5" (1.715 m)     Head Circumference --      Peak Flow --      Pain Score 06/13/23 1029 0     Pain Loc --      Pain Education --      Exclude from Growth Chart --    No data found.  Updated Vital Signs BP (!) 150/88 (BP Location: Left Arm) Comment: just took BP med  Pulse 60   Temp 98.5 F (36.9 C) (Oral)   Resp 18   Ht 5' 7.5" (1.715 m)   Wt 148 lb (67.1 kg)   SpO2 97%   BMI 22.84 kg/m   Visual  Acuity Right Eye Distance:   Left Eye  Distance:   Bilateral Distance:    Right Eye Near:   Left Eye Near:    Bilateral Near:     Physical Exam Constitutional:      Appearance: Normal appearance.  HENT:     Right Ear: Tympanic membrane, ear canal and external ear normal.     Left Ear: Tympanic membrane, ear canal and external ear normal.     Nose: Congestion and rhinorrhea present.     Mouth/Throat:     Mouth: Mucous membranes are moist.     Pharynx: Oropharynx is clear.  Eyes:     Extraocular Movements: Extraocular movements intact.  Cardiovascular:     Rate and Rhythm: Normal rate and regular rhythm.     Pulses: Normal pulses.     Heart sounds: Normal heart sounds.  Pulmonary:     Effort: Pulmonary effort is normal.     Breath sounds: Normal breath sounds.  Neurological:     Mental Status: She is alert and oriented to person, place, and time. Mental status is at baseline.      UC Treatments / Results  Labs (all labs ordered are listed, but only abnormal results are displayed) Labs Reviewed - No data to display  EKG   Radiology No results found.  Procedures Procedures (including critical care time)  Medications Ordered in UC Medications - No data to display  Initial Impression / Assessment and Plan / UC Course  I have reviewed the triage vital signs and the nursing notes.  Pertinent labs & imaging results that were available during my care of the patient were reviewed by me and considered in my medical decision making (see chart for details).  Acute cough  Vitals are stable, O2 saturation 97%, lungs clear to auscultation, no signs of distress or toxic appearing, stable for outpatient management the symptoms have persisted for 1 month chest x-ray pending, will notify via telephone and make adjustments to treatment plan based on results, cefdinir prescribed as she did see improvement, would like to hold off on use of steroids, has cough medicine and inhaler  available at home, recommended additional supportive care with follow-up as needed Final Clinical Impressions(s) / UC Diagnoses   Final diagnoses:  Acute cough     Discharge Instructions      Your symptoms have persisted for  a month despite use of antibiotic, inhaler, steroids and cough medicine we have completed a chest x-ray  Results are pending and you will be notified of results via telephone  Had improvement with use of antibiotic therefore we will initiate that at this time  Begin cefdinir every morning and every evening for 7 days  Will make additional adjustments to your treatment plan based on x-ray results  You can take Tylenol and/or Ibuprofen as needed for fever reduction and pain relief.   For cough: honey 1/2 to 1 teaspoon (you can dilute the honey in water or another fluid).  You can also use guaifenesin and dextromethorphan for cough. You can use a humidifier for chest congestion and cough.  If you don't have a humidifier, you can sit in the bathroom with the hot shower running.      For sore throat: try warm salt water gargles, cepacol lozenges, throat spray, warm tea or water with lemon/honey, popsicles or ice, or OTC cold relief medicine for throat discomfort.   For congestion: take a daily anti-histamine like Zyrtec, Claritin, and a oral decongestant, such as pseudoephedrine.  You can also use Flonase 1-2 sprays  in each nostril daily.   It is important to stay hydrated: drink plenty of fluids (water, gatorade/powerade/pedialyte, juices, or teas) to keep your throat moisturized and help further relieve irritation/discomfort.    ED Prescriptions     Medication Sig Dispense Auth. Provider   cefdinir (OMNICEF) 300 MG capsule Take 1 capsule (300 mg total) by mouth 2 (two) times daily for 7 days. 14 capsule Lejon Afzal, Elita Boone, NP      PDMP not reviewed this encounter.   Valinda Hoar, NP 06/13/23 1101

## 2023-06-13 NOTE — Telephone Encounter (Signed)
Reported chest x-ray results via telephone, 2 patient identifiers used, continuing treatment plan as discussed in clinic

## 2023-06-29 DIAGNOSIS — H43813 Vitreous degeneration, bilateral: Secondary | ICD-10-CM | POA: Diagnosis not present

## 2023-06-29 DIAGNOSIS — H2513 Age-related nuclear cataract, bilateral: Secondary | ICD-10-CM | POA: Diagnosis not present

## 2023-06-29 DIAGNOSIS — H353131 Nonexudative age-related macular degeneration, bilateral, early dry stage: Secondary | ICD-10-CM | POA: Diagnosis not present

## 2023-07-01 DIAGNOSIS — G4733 Obstructive sleep apnea (adult) (pediatric): Secondary | ICD-10-CM | POA: Diagnosis not present

## 2023-07-06 DIAGNOSIS — H2511 Age-related nuclear cataract, right eye: Secondary | ICD-10-CM | POA: Diagnosis not present

## 2023-07-29 ENCOUNTER — Encounter: Payer: Self-pay | Admitting: Ophthalmology

## 2023-07-29 DIAGNOSIS — G4733 Obstructive sleep apnea (adult) (pediatric): Secondary | ICD-10-CM | POA: Diagnosis not present

## 2023-07-29 DIAGNOSIS — R42 Dizziness and giddiness: Secondary | ICD-10-CM | POA: Diagnosis not present

## 2023-07-29 NOTE — Anesthesia Preprocedure Evaluation (Addendum)
 Anesthesia Evaluation  Patient identified by MRN, date of birth, ID band Patient awake    Reviewed: Allergy & Precautions, H&P , NPO status , Patient's Chart, lab work & pertinent test results  History of Anesthesia Complications (+) PONV and history of anesthetic complications  Airway Mallampati: III  TM Distance: <3 FB Neck ROM: Full   Comment: Very short TMD, likely anterior airway Dental no notable dental hx.    Pulmonary sleep apnea , former smoker   Pulmonary exam normal breath sounds clear to auscultation       Cardiovascular hypertension, Normal cardiovascular exam Rhythm:Regular Rate:Normal  04-21-23 office note  1. Paroxysmal SVT  2. Essential hypertension  3. Obstructive sleep apnea  The patient returns today for follow-up, reports "doing okay". The patient denies exertional chest pain. She has mild exertional dyspnea. She gets short of breath when walking uphill. She denies palpitations or heart racing. She denies peripheral edema. The patient is active but does not do any structured exercise. Patient saw her primary care provider at which time she reported fatigue. 7-day Holter monitor 10/07/2022 - 10/14/2022 revealed predominant sinus rhythm with mean heart rate of 65 bpm, sinus heart rate range 34 to 108 bpm, infrequent premature atrial contractions, and infrequent runs of SVT the longest lasting 19 beats and 12 seconds. There was no correlation with diary entries. Patient reports that she does snore, and sleep study was performed 12/11/2022, which revealed sleep apnea. The patient recently started CPAP.  2D echocardiogram 12/02/2022 revealed LVEF greater than 55% with mild mitral and tricuspid regurgitation. CT cardiac scoring was performed 11/24/2022 with a total coronary artery calcium score of 1.35 which is 29th percentile for age and sex matched control with recommendation to continue healthy lifestyle and risk factor  modification.     Neuro/Psych negative neurological ROS  negative psych ROS   GI/Hepatic Neg liver ROS,GERD  ,,  Endo/Other  negative endocrine ROS    Renal/GU negative Renal ROS  negative genitourinary   Musculoskeletal  (+) Arthritis ,    Abdominal   Peds negative pediatric ROS (+)  Hematology negative hematology ROS (+)   Anesthesia Other Findings Atypical mole  Hypertension History of basal cell carcinoma (BCC) BCC (basal cell carcinoma of skin) Actinic keratosis  Sleep apnea PONV (postoperative nausea and vomiting) Motion sickness PSVT (paroxysmal supraventricular tachycardia) (HCC)     Reproductive/Obstetrics negative OB ROS                              Anesthesia Physical Anesthesia Plan  ASA: 3  Anesthesia Plan: MAC   Post-op Pain Management:    Induction: Intravenous  PONV Risk Score and Plan:   Airway Management Planned: Natural Airway and Nasal Cannula  Additional Equipment:   Intra-op Plan:   Post-operative Plan:   Informed Consent: I have reviewed the patients History and Physical, chart, labs and discussed the procedure including the risks, benefits and alternatives for the proposed anesthesia with the patient or authorized representative who has indicated his/her understanding and acceptance.     Dental Advisory Given  Plan Discussed with: Anesthesiologist, CRNA and Surgeon  Anesthesia Plan Comments: (Patient consented for risks of anesthesia including but not limited to:  - adverse reactions to medications - damage to eyes, teeth, lips or other oral mucosa - nerve damage due to positioning  - sore throat or hoarseness - Damage to heart, brain, nerves, lungs, other parts of body or loss of  life  Patient voiced understanding and assent.)        Anesthesia Quick Evaluation

## 2023-07-30 ENCOUNTER — Ambulatory Visit
Admission: RE | Admit: 2023-07-30 | Discharge: 2023-07-30 | Disposition: A | Discharge status: Home / Self Care | Source: Ambulatory Visit | Attending: Otolaryngology | Admitting: Otolaryngology

## 2023-07-30 ENCOUNTER — Other Ambulatory Visit: Payer: Self-pay | Admitting: Otolaryngology

## 2023-07-30 DIAGNOSIS — R42 Dizziness and giddiness: Secondary | ICD-10-CM

## 2023-08-03 NOTE — Discharge Instructions (Signed)

## 2023-08-05 ENCOUNTER — Other Ambulatory Visit: Payer: Self-pay

## 2023-08-05 ENCOUNTER — Other Ambulatory Visit

## 2023-08-05 ENCOUNTER — Encounter: Payer: Self-pay | Admitting: Ophthalmology

## 2023-08-05 ENCOUNTER — Ambulatory Visit: Payer: Self-pay | Admitting: Anesthesiology

## 2023-08-05 ENCOUNTER — Encounter
Admission: RE | Disposition: A | Discharge status: Home / Self Care | Payer: Self-pay | Source: Ambulatory Visit | Attending: Ophthalmology

## 2023-08-05 ENCOUNTER — Ambulatory Visit
Admission: RE | Admit: 2023-08-05 | Discharge: 2023-08-05 | Disposition: A | Discharge status: Home / Self Care | Payer: PPO | Source: Ambulatory Visit | Attending: Ophthalmology | Admitting: Ophthalmology

## 2023-08-05 DIAGNOSIS — I471 Supraventricular tachycardia, unspecified: Secondary | ICD-10-CM | POA: Diagnosis not present

## 2023-08-05 DIAGNOSIS — H2511 Age-related nuclear cataract, right eye: Secondary | ICD-10-CM | POA: Diagnosis not present

## 2023-08-05 DIAGNOSIS — M1990 Unspecified osteoarthritis, unspecified site: Secondary | ICD-10-CM | POA: Diagnosis not present

## 2023-08-05 DIAGNOSIS — I10 Essential (primary) hypertension: Secondary | ICD-10-CM | POA: Diagnosis not present

## 2023-08-05 DIAGNOSIS — G4730 Sleep apnea, unspecified: Secondary | ICD-10-CM | POA: Insufficient documentation

## 2023-08-05 DIAGNOSIS — Z87891 Personal history of nicotine dependence: Secondary | ICD-10-CM | POA: Diagnosis not present

## 2023-08-05 DIAGNOSIS — H269 Unspecified cataract: Secondary | ICD-10-CM | POA: Diagnosis not present

## 2023-08-05 HISTORY — DX: Motion sickness, initial encounter: T75.3XXA

## 2023-08-05 HISTORY — PX: CATARACT EXTRACTION W/PHACO: SHX586

## 2023-08-05 HISTORY — DX: Supraventricular tachycardia, unspecified: I47.10

## 2023-08-05 HISTORY — DX: Nausea with vomiting, unspecified: R11.2

## 2023-08-05 HISTORY — DX: Other specified postprocedural states: Z98.890

## 2023-08-05 SURGERY — PHACOEMULSIFICATION, CATARACT, WITH IOL INSERTION
Anesthesia: Monitor Anesthesia Care | Site: Eye | Laterality: Right

## 2023-08-05 MED ORDER — LIDOCAINE HCL (PF) 2 % IJ SOLN
INTRAOCULAR | Status: DC | PRN
  Administered 2023-08-05: 1 mL via INTRAMUSCULAR

## 2023-08-05 MED ORDER — SIGHTPATH DOSE#1 BSS IO SOLN
INTRAOCULAR | Status: DC | PRN
  Administered 2023-08-05: 15 mL

## 2023-08-05 MED ORDER — MOXIFLOXACIN HCL 0.5 % OP SOLN
OPHTHALMIC | Status: DC | PRN
  Administered 2023-08-05: .2 mL via OPHTHALMIC

## 2023-08-05 MED ORDER — MIDAZOLAM HCL 2 MG/2ML IJ SOLN
INTRAMUSCULAR | Status: DC | PRN
  Administered 2023-08-05 (×2): 1 mg via INTRAVENOUS

## 2023-08-05 MED ORDER — MIDAZOLAM HCL 2 MG/2ML IJ SOLN
INTRAMUSCULAR | Status: AC
  Filled 2023-08-05: qty 2

## 2023-08-05 MED ORDER — ARMC OPHTHALMIC DILATING DROPS
1.0000 | OPHTHALMIC | Status: DC | PRN
  Administered 2023-08-05 (×3): 1 via OPHTHALMIC

## 2023-08-05 MED ORDER — SIGHTPATH DOSE#1 BSS IO SOLN
INTRAOCULAR | Status: DC | PRN
  Administered 2023-08-05: 50 mL via OPHTHALMIC

## 2023-08-05 MED ORDER — ARMC OPHTHALMIC DILATING DROPS
OPHTHALMIC | Status: AC
  Filled 2023-08-05: qty 0.5

## 2023-08-05 MED ORDER — TETRACAINE HCL 0.5 % OP SOLN
OPHTHALMIC | Status: AC
  Filled 2023-08-05: qty 4

## 2023-08-05 MED ORDER — ONDANSETRON HCL 4 MG/2ML IJ SOLN
4.0000 mg | Freq: Once | INTRAMUSCULAR | Status: AC
  Administered 2023-08-05: 4 mg via INTRAVENOUS

## 2023-08-05 MED ORDER — FENTANYL CITRATE (PF) 100 MCG/2ML IJ SOLN
INTRAMUSCULAR | Status: AC
Start: 2023-08-05 — End: ?
  Filled 2023-08-05: qty 2

## 2023-08-05 MED ORDER — ONDANSETRON HCL 4 MG/2ML IJ SOLN
INTRAMUSCULAR | Status: AC
  Filled 2023-08-05: qty 2

## 2023-08-05 MED ORDER — TETRACAINE HCL 0.5 % OP SOLN
1.0000 [drp] | OPHTHALMIC | Status: DC | PRN
  Administered 2023-08-05 (×3): 1 [drp] via OPHTHALMIC

## 2023-08-05 MED ORDER — FENTANYL CITRATE (PF) 100 MCG/2ML IJ SOLN
INTRAMUSCULAR | Status: DC | PRN
  Administered 2023-08-05: 50 ug via INTRAVENOUS

## 2023-08-05 MED ORDER — SIGHTPATH DOSE#1 NA HYALUR & NA CHOND-NA HYALUR IO KIT
PACK | INTRAOCULAR | Status: DC | PRN
  Administered 2023-08-05: 1 via OPHTHALMIC

## 2023-08-05 MED ORDER — BRIMONIDINE TARTRATE-TIMOLOL 0.2-0.5 % OP SOLN
OPHTHALMIC | Status: DC | PRN
  Administered 2023-08-05: 1 [drp] via OPHTHALMIC

## 2023-08-05 SURGICAL SUPPLY — 11 items
CATARACT SUITE SIGHTPATH (MISCELLANEOUS) ×1 IMPLANT
FEE CATARACT SUITE SIGHTPATH (MISCELLANEOUS) ×1 IMPLANT
GLOVE BIOGEL PI IND STRL 8 (GLOVE) ×1 IMPLANT
GLOVE SURG LX STRL 7.5 STRW (GLOVE) ×1 IMPLANT
GLOVE SURG PROTEXIS BL SZ6.5 (GLOVE) ×1 IMPLANT
GLOVE SURG SYN 6.5 PF PI BL (GLOVE) ×1 IMPLANT
LENS CLAREON YELLOW VVTY 25.5 ×1 IMPLANT
LENS IOL CLRN VT YLW 25.5 IMPLANT
NDL FILTER BLUNT 18X1 1/2 (NEEDLE) ×1 IMPLANT
NEEDLE FILTER BLUNT 18X1 1/2 (NEEDLE) ×1 IMPLANT
SYR 3ML LL SCALE MARK (SYRINGE) ×1 IMPLANT

## 2023-08-05 NOTE — Op Note (Signed)
 LOCATION:  [facility]   PREOPERATIVE DIAGNOSIS:    Nuclear sclerotic cataract right eye. H25.11   POSTOPERATIVE DIAGNOSIS:  Nuclear sclerotic cataract right eye.     PROCEDURE:  Phacoemusification with posterior chamber intraocular lens placement of the right eye   ULTRASOUND TIME: Procedure(s): CATARACT EXTRACTION PHACO AND INTRAOCULAR LENS PLACEMENT (IOC) RIGHT  CLAREON VIVITY  4.90  00:27.3 (Right)  LENS:   Implant Name Type Inv. Item Serial No. Manufacturer Lot No. LRB No. Used Action  LENS CLAREON YELLOW VVTY 25.5 - [id number]  LENS CLAREON YELLOW VVTY 25.5 [id number] [employer]  Right 1 Implanted   [id number] 25.5 Vivity      SURGEON:  Deirdre Evener, MD   ANESTHESIA:  Topical with tetracaine drops and 2% Xylocaine jelly, augmented with 1% preservative-free intracameral lidocaine.    COMPLICATIONS:  None.   DESCRIPTION OF PROCEDURE:  The patient was identified in the holding room and transported to the operating room and placed in the supine position under the operating microscope.  The right eye was identified as the operative eye and it was prepped and draped in the usual sterile ophthalmic fashion.   A 1 millimeter clear-corneal paracentesis was made at the 12:00 position.  0.5 ml of preservative-free 1% lidocaine was injected into the anterior chamber. The anterior chamber was filled with Viscoat viscoelastic.  A 2.4 millimeter keratome was used to make a near-clear corneal incision at the 9:00 position.  A curvilinear capsulorrhexis was made with a cystotome and capsulorrhexis forceps.  Balanced salt solution was used to hydrodissect and hydrodelineate the nucleus.   Phacoemulsification was then used in stop and chop fashion to remove the lens nucleus and epinucleus.  The remaining cortex was then removed using the irrigation and aspiration handpiece. Provisc was then placed into the capsular bag to distend it for lens placement.  A lens was then injected into  the capsular bag.  The remaining viscoelastic was aspirated.   Wounds were hydrated with balanced salt solution.  The anterior chamber was inflated to a physiologic pressure with balanced salt solution.  No wound leaks were noted. Vigamox 0.2 ml of a 1mg  per ml solution was injected into the anterior chamber for a dose of 0.2 mg of intracameral antibiotic at the completion of the case.   Timolol and Brimonidine drops were applied to the eye.  The patient was taken to the recovery room in stable condition without complications of anesthesia or surgery.   Adream Parzych 08/05/2023, 8:16 AM

## 2023-08-05 NOTE — H&P (Signed)
 [facility]   Primary Care Physician:  Jerl Mina, MD Ophthalmologist: Dr. Lockie Mola  Pre-Procedure History & Physical: HPI:  Shelley Sims is a 78 y.o. female here for ophthalmic surgery.   Past Medical History:  Diagnosis Date   Actinic keratosis    Atypical mole 09/03/2017   right low back 4.0cm lat to spine   BCC (basal cell carcinoma of skin)    L eye, pt is scheduled for [facility] with Dr. Lorn Junes 03/28/21   History of basal cell carcinoma (BCC) 02/27/2020   right nasal sidewall   Hypertension    Motion sickness    PONV (postoperative nausea and vomiting)    PSVT (paroxysmal supraventricular tachycardia) (HCC)    Sleep apnea    CPAP    Past Surgical History:  Procedure Laterality Date   COLONOSCOPY     COLONOSCOPY WITH PROPOFOL N/A 10/18/2019   Procedure: COLONOSCOPY WITH PROPOFOL;  Surgeon: [name], MD;  Location: [facility] ENDOSCOPY;  Service: Endoscopy;  Laterality: N/A;   ESOPHAGOGASTRODUODENOSCOPY (EGD) WITH PROPOFOL N/A 10/18/2019   Procedure: ESOPHAGOGASTRODUODENOSCOPY (EGD) WITH PROPOFOL;  Surgeon: [name], MD;  Location: [facility] ENDOSCOPY;  Service: Endoscopy;  Laterality: N/A;   FOOT SURGERY     HAND SURGERY      Prior to Admission medications   Medication Sig Start Date End Date Taking? Authorizing Provider  ALPRAZolam (XANAX) 0.5 MG tablet TAKE ONE-HALF TO ONE TABLET EVERY EIGHT HOURS AS NEEDED 07/13/14  Yes [provider]  AMBULATORY NON FORMULARY MEDICATION Medication Name: Compounded Tretinoin 0.0125%, Niacinamide 2%, Hyalyronic Acid 0.25% Cream (Skin Medicinals) Apply pea sized amount nightly to the entire face as directed . 08/19/22  Yes [name], [name], MD  Calcium Carbonate-Vitamin D 600-200 MG-UNIT TABS Take by mouth.   Yes [provider]  clobetasol cream (TEMOVATE) 0.05 % Apply to skin  twice daily as needed to affected areas for two weeks then apply twice daily on weekends only 09/13/20  Yes Deirdre Evener, MD   escitalopram (LEXAPRO) 10 MG tablet  08/28/21  Yes [provider]  meclizine (ANTIVERT) 25 MG tablet Take 25 mg by mouth 3 (three) times daily as needed for dizziness.   Yes [provider]  mometasone (ELOCON) 0.1 % cream Apply 1 Application topically as directed. qd up to 5 days a week to aa psoriasis scalp, ears prn flares 02/05/23  Yes Deirdre Evener, MD  Multiple Vitamins-Minerals (ICAPS AREDS FORMULA PO) Take by mouth in the morning and at bedtime.   Yes [provider]  nitroGLYCERIN (NITRODUR - DOSED IN MG/24 HR) 0.2 mg/hr patch USE 1/4 PATCH FOR 24 HOURS TO THE AFFECTED AREA 10/12/18  Yes Enid Baas, MD  olmesartan (BENICAR) 20 MG tablet Take 20 mg by mouth daily. 07/11/20  Yes [provider]  TURMERIC PO Take by mouth.   Yes [provider]  Zinc Sulfate (ZINC 15 PO) Take by mouth.   Yes [provider]  Clobetasol Prop Emollient Base (CLOBETASOL PROPIONATE E) 0.05 % emollient cream Apply to skin  twice daily as needed to affected areas for two weeks then apply twice daily on weekends only 09/13/20   Deirdre Evener, MD    Allergies as of 07/02/2023 - Review Complete 06/13/2023  Allergen Reaction Noted   Penicillins  03/09/2008    History reviewed. No pertinent family history.  Social History   Socioeconomic History   Marital status: Married    Spouse name: Not on file   Number  of children: Not on file   Years of education: Not on file   Highest education level: Not on file  Occupational History   Not on file  Tobacco Use   Smoking status: Former    Types: Cigarettes   Smokeless tobacco: Never   Tobacco comments:    Smoked socially in college  Vaping Use   Vaping status: Never Used  Substance and Sexual Activity   Alcohol use: Yes    Comment: Occasional;   Drug use: Never   Sexual activity: Not Currently    Birth control/protection: Post-menopausal  Other Topics Concern   Not on file  Social History  Narrative   Not on file   Social Drivers of Health   Financial Resource Strain: Low Risk  (02/24/2023)   Received from [facility]   Overall Financial Resource Strain (CARDIA)    Difficulty of Paying Living Expenses: Not very hard  Food Insecurity: No Food Insecurity (02/24/2023)   Received from [facility]   Hunger Vital Sign    Worried About Running Out of Food in the Last Year: Never true    Ran Out of Food in the Last Year: Never true  Transportation Needs: No Transportation Needs (02/24/2023)   Received from [facility] - Transportation    In the past 12 months, has lack of transportation kept you from medical appointments or from getting medications?: No    Lack of Transportation (Non-Medical): No  Physical Activity: Not on file  Stress: Not on file  Social Connections: Not on file  Intimate Partner Violence: Not on file    Review of Systems: See HPI, otherwise negative ROS  Physical Exam: BP 135/81   Temp (!) 97.3 F (36.3 C) (Temporal)   Resp 12   Ht 5' 7.01" (1.702 m)   Wt 67.7 kg   SpO2 96%   BMI 23.36 kg/m  General:   Alert,  pleasant and cooperative in NAD Head:  Normocephalic and atraumatic. Lungs:  Clear to auscultation.    Heart:  Regular rate and rhythm.   Impression/Plan: Shelley Sims is here for ophthalmic surgery.  Risks, benefits, limitations, and alternatives regarding ophthalmic surgery have been reviewed with the patient.  Questions have been answered.  All parties agreeable.   Lockie Mola, MD  08/05/2023, 7:32 AM

## 2023-08-05 NOTE — Transfer of Care (Signed)
 Immediate Anesthesia Transfer of Care Note  Patient: Shelley Sims  Procedure(s) Performed: CATARACT EXTRACTION PHACO AND INTRAOCULAR LENS PLACEMENT (IOC) RIGHT  CLAREON VIVITY  4.90  00:27.3 (Right: Eye)  Patient Location: PACU  Anesthesia Type: MAC  Level of Consciousness: awake, alert  and patient cooperative  Airway and Oxygen Therapy: Patient Spontanous Breathing and Patient connected to supplemental oxygen  Post-op Assessment: Post-op Vital signs reviewed, Patient's Cardiovascular Status Stable, Respiratory Function Stable, Patent Airway and No signs of Nausea or vomiting  Post-op Vital Signs: Reviewed and stable  Complications: No notable events documented.

## 2023-08-05 NOTE — Anesthesia Postprocedure Evaluation (Signed)
 Anesthesia Post Note  Patient: Shelley Sims  Procedure(s) Performed: CATARACT EXTRACTION PHACO AND INTRAOCULAR LENS PLACEMENT (IOC) RIGHT  CLAREON VIVITY  4.90  00:27.3 (Right: Eye)  Patient location during evaluation: PACU Anesthesia Type: MAC Level of consciousness: awake and alert Pain management: pain level controlled Vital Signs Assessment: post-procedure vital signs reviewed and stable Respiratory status: spontaneous breathing, nonlabored ventilation, respiratory function stable and patient connected to nasal cannula oxygen Cardiovascular status: stable and blood pressure returned to baseline Postop Assessment: no apparent nausea or vomiting Anesthetic complications: no   No notable events documented.   Last Vitals:  Vitals:   08/05/23 0818 08/05/23 0820  BP: (!) 112/55 102/63  Pulse: 62 (!) 59  Resp: 11 (!) 8  Temp: 36.8 C   SpO2: 97% 96%    Last Pain:  Vitals:   08/05/23 0820  TempSrc:   PainSc: 0-No pain                 Lucianna Ostlund C Shaquisha Wynn

## 2023-08-06 ENCOUNTER — Encounter: Payer: Self-pay | Admitting: Ophthalmology

## 2023-08-06 DIAGNOSIS — H2512 Age-related nuclear cataract, left eye: Secondary | ICD-10-CM | POA: Diagnosis not present

## 2023-08-10 NOTE — Anesthesia Preprocedure Evaluation (Addendum)
 Anesthesia Evaluation  Patient identified by MRN, date of birth, ID band Patient awake    Reviewed: Allergy & Precautions, H&P , NPO status , Patient's Chart, lab work & pertinent test results  History of Anesthesia Complications (+) PONV and history of anesthetic complications  Airway Mallampati: III  TM Distance: <3 FB Neck ROM: Full    Dental no notable dental hx.    Pulmonary neg pulmonary ROS, sleep apnea , former smoker   Pulmonary exam normal breath sounds clear to auscultation       Cardiovascular hypertension, negative cardio ROS Normal cardiovascular exam Rhythm:Regular Rate:Normal  04-21-23 office note   1. Paroxysmal SVT  2. Essential hypertension  3. Obstructive sleep apnea   The patient returns today for follow-up, reports "doing okay". The patient denies exertional chest pain. She has mild exertional dyspnea. She gets short of breath when walking uphill. She denies palpitations or heart racing. She denies peripheral edema. The patient is active but does not do any structured exercise. Patient saw her primary care provider at which time she reported fatigue. 7-day Holter monitor 10/07/2022 - 10/14/2022 revealed predominant sinus rhythm with mean heart rate of 65 bpm, sinus heart rate range 34 to 108 bpm, infrequent premature atrial contractions, and infrequent runs of SVT the longest lasting 19 beats and 12 seconds. There was no correlation with diary entries. Patient reports that she does snore, and sleep study was performed 12/11/2022, which revealed sleep apnea. The patient recently started CPAP.   2D echocardiogram 12/02/2022 revealed LVEF greater than 55% with mild mitral and tricuspid regurgitation. CT cardiac scoring was performed 11/24/2022 with a total coronary artery calcium score of 1.35 which is 29th percentile for age and sex matched control with recommendation to continue healthy lifestyle and risk factor  modification.        Neuro/Psych negative neurological ROS  negative psych ROS   GI/Hepatic negative GI ROS, Neg liver ROS,GERD  ,,  Endo/Other  negative endocrine ROS    Renal/GU negative Renal ROS  negative genitourinary   Musculoskeletal negative musculoskeletal ROS (+) Arthritis ,    Abdominal   Peds negative pediatric ROS (+)  Hematology negative hematology ROS (+)   Anesthesia Other Findings Previous cataract surgery 08-05-23 Dr. Juel Burrow anesthesiologist  Atypical mole  Hypertension History of basal cell carcinoma (BCC) BCC (basal cell carcinoma of skin) Actinic keratosis  Sleep apnea PONV (postoperative nausea and vomiting) Motion sickness PSVT (paroxysmal supraventricular tachycardia) (HCC)     Reproductive/Obstetrics negative OB ROS                             Anesthesia Physical Anesthesia Plan  ASA: 3  Anesthesia Plan: MAC   Post-op Pain Management:    Induction: Intravenous  PONV Risk Score and Plan:   Airway Management Planned: Natural Airway and Nasal Cannula  Additional Equipment:   Intra-op Plan:   Post-operative Plan:   Informed Consent: I have reviewed the patients History and Physical, chart, labs and discussed the procedure including the risks, benefits and alternatives for the proposed anesthesia with the patient or authorized representative who has indicated his/her understanding and acceptance.     Dental Advisory Given  Plan Discussed with: Anesthesiologist, CRNA and Surgeon  Anesthesia Plan Comments: (Patient consented for risks of anesthesia including but not limited to:  - adverse reactions to medications - damage to eyes, teeth, lips or other oral mucosa - nerve damage due to positioning  -  sore throat or hoarseness - Damage to heart, brain, nerves, lungs, other parts of body or loss of life  Patient voiced understanding and assent.)        Anesthesia Quick Evaluation

## 2023-08-17 NOTE — Discharge Instructions (Signed)

## 2023-08-19 ENCOUNTER — Ambulatory Visit: Payer: Self-pay | Admitting: Anesthesiology

## 2023-08-19 ENCOUNTER — Ambulatory Visit
Admission: RE | Admit: 2023-08-19 | Discharge: 2023-08-19 | Disposition: A | Discharge status: Home / Self Care | Payer: PPO | Source: Ambulatory Visit | Attending: Ophthalmology | Admitting: Ophthalmology

## 2023-08-19 ENCOUNTER — Encounter
Admission: RE | Disposition: A | Discharge status: Home / Self Care | Payer: Self-pay | Source: Ambulatory Visit | Attending: Ophthalmology

## 2023-08-19 ENCOUNTER — Other Ambulatory Visit: Payer: Self-pay

## 2023-08-19 ENCOUNTER — Encounter: Payer: Self-pay | Admitting: Ophthalmology

## 2023-08-19 DIAGNOSIS — I10 Essential (primary) hypertension: Secondary | ICD-10-CM | POA: Diagnosis not present

## 2023-08-19 DIAGNOSIS — H2512 Age-related nuclear cataract, left eye: Secondary | ICD-10-CM | POA: Insufficient documentation

## 2023-08-19 DIAGNOSIS — G4733 Obstructive sleep apnea (adult) (pediatric): Secondary | ICD-10-CM | POA: Insufficient documentation

## 2023-08-19 DIAGNOSIS — K219 Gastro-esophageal reflux disease without esophagitis: Secondary | ICD-10-CM | POA: Diagnosis not present

## 2023-08-19 DIAGNOSIS — I081 Rheumatic disorders of both mitral and tricuspid valves: Secondary | ICD-10-CM | POA: Insufficient documentation

## 2023-08-19 DIAGNOSIS — M1990 Unspecified osteoarthritis, unspecified site: Secondary | ICD-10-CM | POA: Diagnosis not present

## 2023-08-19 DIAGNOSIS — Z87891 Personal history of nicotine dependence: Secondary | ICD-10-CM | POA: Insufficient documentation

## 2023-08-19 DIAGNOSIS — H269 Unspecified cataract: Secondary | ICD-10-CM | POA: Diagnosis not present

## 2023-08-19 DIAGNOSIS — I472 Ventricular tachycardia, unspecified: Secondary | ICD-10-CM | POA: Diagnosis not present

## 2023-08-19 HISTORY — PX: CATARACT EXTRACTION W/PHACO: SHX586

## 2023-08-19 SURGERY — PHACOEMULSIFICATION, CATARACT, WITH IOL INSERTION
Anesthesia: Monitor Anesthesia Care | Site: Eye | Laterality: Left

## 2023-08-19 MED ORDER — ONDANSETRON HCL 4 MG/2ML IJ SOLN
4.0000 mg | Freq: Once | INTRAMUSCULAR | Status: AC
  Administered 2023-08-19: 4 mg via INTRAVENOUS

## 2023-08-19 MED ORDER — LIDOCAINE HCL (PF) 2 % IJ SOLN
INTRAOCULAR | Status: DC | PRN
  Administered 2023-08-19: 2 mL

## 2023-08-19 MED ORDER — MOXIFLOXACIN HCL 0.5 % OP SOLN
OPHTHALMIC | Status: DC | PRN
  Administered 2023-08-19: .2 mL via OPHTHALMIC

## 2023-08-19 MED ORDER — BRIMONIDINE TARTRATE-TIMOLOL 0.2-0.5 % OP SOLN
OPHTHALMIC | Status: DC | PRN
  Administered 2023-08-19: 1 [drp] via OPHTHALMIC

## 2023-08-19 MED ORDER — ARMC OPHTHALMIC DILATING DROPS
OPHTHALMIC | Status: AC
  Filled 2023-08-19: qty 0.5

## 2023-08-19 MED ORDER — ARMC OPHTHALMIC DILATING DROPS
1.0000 | OPHTHALMIC | Status: DC | PRN
  Administered 2023-08-19 (×3): 1 via OPHTHALMIC

## 2023-08-19 MED ORDER — TETRACAINE HCL 0.5 % OP SOLN
OPHTHALMIC | Status: AC
  Filled 2023-08-19: qty 4

## 2023-08-19 MED ORDER — EPINEPHRINE PF 1 MG/ML IJ SOLN
INTRAMUSCULAR | Status: DC | PRN
  Administered 2023-08-19: 86 mL via OPHTHALMIC

## 2023-08-19 MED ORDER — SIGHTPATH DOSE#1 BSS IO SOLN
INTRAOCULAR | Status: DC | PRN
  Administered 2023-08-19: 15 mL via INTRAOCULAR

## 2023-08-19 MED ORDER — MIDAZOLAM HCL 2 MG/2ML IJ SOLN
INTRAMUSCULAR | Status: AC
  Filled 2023-08-19: qty 2

## 2023-08-19 MED ORDER — ONDANSETRON HCL 4 MG/2ML IJ SOLN
INTRAMUSCULAR | Status: AC
  Filled 2023-08-19: qty 2

## 2023-08-19 MED ORDER — SIGHTPATH DOSE#1 NA HYALUR & NA CHOND-NA HYALUR IO KIT
PACK | INTRAOCULAR | Status: DC | PRN
  Administered 2023-08-19: 1 via OPHTHALMIC

## 2023-08-19 MED ORDER — FENTANYL CITRATE (PF) 100 MCG/2ML IJ SOLN
INTRAMUSCULAR | Status: DC | PRN
  Administered 2023-08-19: 50 ug via INTRAVENOUS

## 2023-08-19 MED ORDER — MIDAZOLAM HCL 2 MG/2ML IJ SOLN
INTRAMUSCULAR | Status: DC | PRN
  Administered 2023-08-19 (×2): 1 mg via INTRAVENOUS

## 2023-08-19 MED ORDER — TETRACAINE HCL 0.5 % OP SOLN
1.0000 [drp] | OPHTHALMIC | Status: DC | PRN
  Administered 2023-08-19 (×3): 1 [drp] via OPHTHALMIC

## 2023-08-19 MED ORDER — FENTANYL CITRATE (PF) 100 MCG/2ML IJ SOLN
INTRAMUSCULAR | Status: AC
  Filled 2023-08-19: qty 2

## 2023-08-19 SURGICAL SUPPLY — 11 items
CATARACT SUITE SIGHTPATH (MISCELLANEOUS) ×1 IMPLANT
FEE CATARACT SUITE SIGHTPATH (MISCELLANEOUS) ×1 IMPLANT
GLOVE BIOGEL PI IND STRL 8 (GLOVE) ×1 IMPLANT
GLOVE SURG LX STRL 7.5 STRW (GLOVE) ×1 IMPLANT
GLOVE SURG PROTEXIS BL SZ6.5 (GLOVE) ×1 IMPLANT
GLOVE SURG SYN 6.5 PF PI BL (GLOVE) ×1 IMPLANT
LENS CLRN VIVITY TORIC 3 22.0 ×1 IMPLANT
LENS IOL CLRN VT TRC 3 22.0 IMPLANT
NDL FILTER BLUNT 18X1 1/2 (NEEDLE) ×1 IMPLANT
NEEDLE FILTER BLUNT 18X1 1/2 (NEEDLE) ×1 IMPLANT
SYR 3ML LL SCALE MARK (SYRINGE) ×1 IMPLANT

## 2023-08-19 NOTE — Op Note (Signed)
 LOCATION:  [facility]   PREOPERATIVE DIAGNOSIS:  Nuclear sclerotic cataract of the left eye.  H25.12  POSTOPERATIVE DIAGNOSIS:  Nuclear sclerotic cataract of the left eye.   PROCEDURE:  Phacoemulsification with Toric posterior chamber intraocular lens placement of the left eye.  Ultrasound time: Procedure(s): CATARACT EXTRACTION PHACO AND INTRAOCULAR LENS PLACEMENT (IOC) LEFT  CLAREON VIVITY TORIC 5.58 0:26.4 (Left)  LENS:   Implant Name Type Inv. Item Serial No. Manufacturer Lot No. LRB No. Used Action  LENS CLRN VIVITY TORIC 3 22.0 - [id number]  LENS CLRN VIVITY TORIC 3 22.0 [id number] [employer]  Left 1 Implanted     [id number] Vivity Toric intraocular lens with 1.5 diopters of cylindrical power with axis orientation at 76 degrees.     SURGEON:  Deirdre Evener, MD   ANESTHESIA:  Topical with tetracaine drops and 2% Xylocaine jelly, augmented with 1% preservative-free intracameral lidocaine.  COMPLICATIONS:  None.   DESCRIPTION OF PROCEDURE:  The patient was identified in the holding room and transported to the operating suite and placed in the supine position under the operating microscope.  The left eye was identified as the operative eye, and it was prepped and draped in the usual sterile ophthalmic fashion.    A clear-corneal paracentesis incision was made at the 1:30 position.  0.5 ml of preservative-free 1% lidocaine was injected into the anterior chamber. The anterior chamber was filled with Viscoat.  A 2.4 millimeter near clear corneal incision was then made at the 10:30 position.  A cystotome and capsulorrhexis forceps were then used to make a curvilinear capsulorrhexis.  Hydrodissection and hydrodelineation were then performed using balanced salt solution.   Phacoemulsification was then used in stop and chop fashion to remove the lens, nucleus and epinucleus.  The remaining cortex was aspirated using the irrigation and aspiration handpiece.  Provisc  viscoelastic was then placed into the capsular bag to distend it for lens placement.  The Verion digital marker was used to align the implant at the intended axis.   A Toric lens was then injected into the capsular bag.  It was rotated clockwise until the axis marks on the lens were approximately 15 degrees in the counterclockwise direction to the intended alignment.  The viscoelastic was aspirated from the eye using the irrigation aspiration handpiece.  Then, a Koch spatula through the sideport incision was used to rotate the lens in a clockwise direction until the axis markings of the intraocular lens were lined up with the Verion alignment.  Balanced salt solution was then used to hydrate the wounds. Vigamox 0.2 ml of a 1mg  per ml solution was injected into the anterior chamber for a dose of 0.2 mg of intracameral antibiotic at the completion of the case.    The eye was noted to have a physiologic pressure and there was no wound leak noted.   Timolol and Brimonidine drops were applied to the eye.  The patient was taken to the recovery room in stable condition having had no complications of anesthesia or surgery.  Jvion Turgeon 08/19/2023, 9:09 AM

## 2023-08-19 NOTE — H&P (Signed)
 [facility]   Primary Care Physician:  Jerl Mina, MD Ophthalmologist: Dr. Lockie Mola  Pre-Procedure History & Physical: HPI:  Shelley Sims is a 78 y.o. female here for ophthalmic surgery.   Past Medical History:  Diagnosis Date   Actinic keratosis    Atypical mole 09/03/2017   right low back 4.0cm lat to spine   BCC (basal cell carcinoma of skin)    L eye, pt is scheduled for [facility] with Dr. Lorn Junes 03/28/21   History of basal cell carcinoma (BCC) 02/27/2020   right nasal sidewall   Hypertension    Motion sickness    PONV (postoperative nausea and vomiting)    PSVT (paroxysmal supraventricular tachycardia) (HCC)    Sleep apnea    CPAP    Past Surgical History:  Procedure Laterality Date   CATARACT EXTRACTION W/PHACO Right 08/05/2023   Procedure: CATARACT EXTRACTION PHACO AND INTRAOCULAR LENS PLACEMENT (IOC) RIGHT  CLAREON VIVITY  4.90  00:27.3;  Surgeon: Lockie Mola, MD;  Location: [facility];  Service: Ophthalmology;  Laterality: Right;   COLONOSCOPY     COLONOSCOPY WITH PROPOFOL N/A 10/18/2019   Procedure: COLONOSCOPY WITH PROPOFOL;  Surgeon: [name], MD;  Location: [facility] ENDOSCOPY;  Service: Endoscopy;  Laterality: N/A;   ESOPHAGOGASTRODUODENOSCOPY (EGD) WITH PROPOFOL N/A 10/18/2019   Procedure: ESOPHAGOGASTRODUODENOSCOPY (EGD) WITH PROPOFOL;  Surgeon: [name], MD;  Location: [facility] ENDOSCOPY;  Service: Endoscopy;  Laterality: N/A;   FOOT SURGERY     HAND SURGERY      Prior to Admission medications   Medication Sig Start Date End Date Taking? Authorizing Provider  ALPRAZolam (XANAX) 0.5 MG tablet TAKE ONE-HALF TO ONE TABLET EVERY EIGHT HOURS AS NEEDED 07/13/14  Yes [provider]  AMBULATORY NON FORMULARY MEDICATION Medication Name: Compounded Tretinoin 0.0125%, Niacinamide 2%, Hyalyronic Acid 0.25% Cream (S[employer]) Apply pea sized amount nightly to the entire face as directed . 08/19/22  Yes [name], [name], MD   Calcium Carbonate-Vitamin D 600-200 MG-UNIT TABS Take by mouth.   Yes [provider]  cetirizine (ZYRTEC) 5 MG chewable tablet Chew 5 mg by mouth daily.   Yes [provider]  clobetasol cream (TEMOVATE) 0.05 % Apply to skin  twice daily as needed to affected areas for two weeks then apply twice daily on weekends only 09/13/20  Yes Deirdre Evener, MD  Clobetasol Prop Emollient Base (CLOBETASOL PROPIONATE E) 0.05 % emollient cream Apply to skin  twice daily as needed to affected areas for two weeks then apply twice daily on weekends only 09/13/20  Yes Deirdre Evener, MD  escitalopram (LEXAPRO) 10 MG tablet  08/28/21  Yes [provider]  meclizine (ANTIVERT) 25 MG tablet Take 25 mg by mouth 3 (three) times daily as needed for dizziness.   Yes [provider]  Multiple Vitamins-Minerals (ICAPS AREDS FORMULA PO) Take by mouth in the morning and at bedtime.   Yes [provider]  olmesartan (BENICAR) 20 MG tablet Take 20 mg by mouth daily. 07/11/20  Yes [provider]  TURMERIC PO Take by mouth.   Yes [provider]  Zinc Sulfate (ZINC 15 PO) Take by mouth.   Yes [provider]  mometasone (ELOCON) 0.1 % cream Apply 1 Application topically as directed. qd up to 5 days a week to aa psoriasis scalp, ears prn flares 02/05/23   Deirdre Evener, MD  nitroGLYCERIN (NITRODUR - DOSED IN MG/24 HR) 0.2 mg/hr patch USE 1/4 PATCH FOR 24 HOURS TO  THE AFFECTED AREA 10/12/18   Enid Baas, MD    Allergies as of 07/02/2023 - Review Complete 06/13/2023  Allergen Reaction Noted   Penicillins  03/09/2008    History reviewed. No pertinent family history.  Social History   Socioeconomic History   Marital status: Married    Spouse name: Not on file   Number of children: Not on file   Years of education: Not on file   Highest education level: Not on file  Occupational History   Not on file  Tobacco Use   Smoking status: Former     Types: Cigarettes   Smokeless tobacco: Never   Tobacco comments:    Smoked socially in college  Vaping Use   Vaping status: Never Used  Substance and Sexual Activity   Alcohol use: Yes    Comment: Occasional;   Drug use: Never   Sexual activity: Not Currently    Birth control/protection: Post-menopausal  Other Topics Concern   Not on file  Social History Narrative   Not on file   Social Drivers of Health   Financial Resource Strain: Low Risk  (02/24/2023)   Received from [facility]   Overall Financial Resource Strain (CARDIA)    Difficulty of Paying Living Expenses: Not very hard  Food Insecurity: No Food Insecurity (02/24/2023)   Received from [facility]   Hunger Vital Sign    Worried About Running Out of Food in the Last Year: Never true    Ran Out of Food in the Last Year: Never true  Transportation Needs: No Transportation Needs (02/24/2023)   Received from [facility] - Transportation    In the past 12 months, has lack of transportation kept you from medical appointments or from getting medications?: No    Lack of Transportation (Non-Medical): No  Physical Activity: Not on file  Stress: Not on file  Social Connections: Not on file  Intimate Partner Violence: Not on file    Review of Systems: See HPI, otherwise negative ROS  Physical Exam: BP (!) 123/56   Pulse 63   Temp 97.8 F (36.6 C) (Temporal)   Resp 12   Ht 5\' 7"  (1.702 m)   Wt 66.2 kg   SpO2 99%   BMI 22.87 kg/m  General:   Alert,  pleasant and cooperative in NAD Head:  Normocephalic and atraumatic. Lungs:  Clear to auscultation.    Heart:  Regular rate and rhythm.   Impression/Plan: Shelley Sims is here for ophthalmic surgery.  Risks, benefits, limitations, and alternatives regarding ophthalmic surgery have been reviewed with the patient.  Questions have been answered.  All parties agreeable.   Lockie Mola, MD   08/19/2023, 8:05 AM

## 2023-08-19 NOTE — Transfer of Care (Signed)
 Immediate Anesthesia Transfer of Care Note  Patient: Shelley Sims  Procedure(s) Performed: CATARACT EXTRACTION PHACO AND INTRAOCULAR LENS PLACEMENT (IOC) LEFT  CLAREON VIVITY TORIC 5.58 0:26.4 (Left: Eye)  Patient Location: PACU  Anesthesia Type: MAC  Level of Consciousness: awake, alert  and patient cooperative  Airway and Oxygen Therapy: Patient Spontanous Breathing and Patient connected to supplemental oxygen  Post-op Assessment: Post-op Vital signs reviewed, Patient's Cardiovascular Status Stable, Respiratory Function Stable, Patent Airway and No signs of Nausea or vomiting  Post-op Vital Signs: Reviewed and stable  Complications: No notable events documented.

## 2023-08-19 NOTE — Anesthesia Postprocedure Evaluation (Signed)
 Anesthesia Post Note  Patient: Shelley Sims  Procedure(s) Performed: CATARACT EXTRACTION PHACO AND INTRAOCULAR LENS PLACEMENT (IOC) LEFT  CLAREON VIVITY TORIC 5.58 0:26.4 (Left: Eye)  Patient location during evaluation: PACU Anesthesia Type: MAC Level of consciousness: awake and alert Pain management: pain level controlled Vital Signs Assessment: post-procedure vital signs reviewed and stable Respiratory status: spontaneous breathing, nonlabored ventilation, respiratory function stable and patient connected to nasal cannula oxygen Cardiovascular status: stable and blood pressure returned to baseline Postop Assessment: no apparent nausea or vomiting Anesthetic complications: no   No notable events documented.   Last Vitals:  Vitals:   08/19/23 0910 08/19/23 0914  BP:    Pulse: 61 65  Resp: 12 12  Temp:  (!) 36.1 C  SpO2: 96% 95%    Last Pain:  Vitals:   08/19/23 0914  TempSrc:   PainSc: 0-No pain                 Cinda Hara C Reika Callanan

## 2023-08-20 ENCOUNTER — Encounter: Payer: Self-pay | Admitting: Ophthalmology

## 2023-08-29 DIAGNOSIS — G4733 Obstructive sleep apnea (adult) (pediatric): Secondary | ICD-10-CM | POA: Diagnosis not present

## 2023-09-08 DIAGNOSIS — H353131 Nonexudative age-related macular degeneration, bilateral, early dry stage: Secondary | ICD-10-CM | POA: Diagnosis not present

## 2023-09-17 DIAGNOSIS — G4733 Obstructive sleep apnea (adult) (pediatric): Secondary | ICD-10-CM | POA: Diagnosis not present

## 2023-10-01 DIAGNOSIS — G4733 Obstructive sleep apnea (adult) (pediatric): Secondary | ICD-10-CM | POA: Diagnosis not present

## 2023-10-01 DIAGNOSIS — E041 Nontoxic single thyroid nodule: Secondary | ICD-10-CM | POA: Diagnosis not present

## 2023-10-01 DIAGNOSIS — R42 Dizziness and giddiness: Secondary | ICD-10-CM | POA: Diagnosis not present

## 2023-10-12 ENCOUNTER — Ambulatory Visit: Admitting: Podiatry

## 2023-10-12 ENCOUNTER — Encounter: Payer: Self-pay | Admitting: Podiatry

## 2023-10-12 DIAGNOSIS — M205X2 Other deformities of toe(s) (acquired), left foot: Secondary | ICD-10-CM | POA: Diagnosis not present

## 2023-10-12 DIAGNOSIS — M2042 Other hammer toe(s) (acquired), left foot: Secondary | ICD-10-CM

## 2023-10-12 NOTE — Progress Notes (Signed)
 She presents today with a chief concern of LT deformity with redness at the base of the toenail she feels like she wore a pair of dress shoes and that may have caused some rubbing to the toe.  She says it does not feel as irritated as it has been but she is questioning whether or not she should continue to wear her orthotics on a regular basis.  Objective: Vitals are stable she alert and oriented x 3 she does have mallet toe deformity which is rigid in nature second digit left foot.  It is mildly erythematous appears to be arthritis mobic mobic spots are not there again.  There is a small palpable ridge across the DIPJ consistent with osteoarthritic change.  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  Plan: We did discuss etiology pathology conservative or surgical therapies we discussed the possible need for surgical intervention.  At this point she is going to initially try Voltaren gel and other topical anti-inflammatories I will follow-up with her on an as-needed basis.

## 2023-11-04 DIAGNOSIS — R0602 Shortness of breath: Secondary | ICD-10-CM | POA: Diagnosis not present

## 2023-11-04 DIAGNOSIS — G4733 Obstructive sleep apnea (adult) (pediatric): Secondary | ICD-10-CM | POA: Diagnosis not present

## 2023-11-04 DIAGNOSIS — I10 Essential (primary) hypertension: Secondary | ICD-10-CM | POA: Diagnosis not present

## 2023-11-04 DIAGNOSIS — I471 Supraventricular tachycardia, unspecified: Secondary | ICD-10-CM | POA: Diagnosis not present

## 2023-11-10 DIAGNOSIS — G4733 Obstructive sleep apnea (adult) (pediatric): Secondary | ICD-10-CM | POA: Diagnosis not present

## 2023-12-10 DIAGNOSIS — G4733 Obstructive sleep apnea (adult) (pediatric): Secondary | ICD-10-CM | POA: Diagnosis not present

## 2023-12-16 ENCOUNTER — Encounter: Payer: Self-pay | Admitting: Dermatology

## 2023-12-16 ENCOUNTER — Ambulatory Visit: Admitting: Dermatology

## 2023-12-16 DIAGNOSIS — Z7189 Other specified counseling: Secondary | ICD-10-CM

## 2023-12-16 DIAGNOSIS — H02882 Meibomian gland dysfunction right lower eyelid: Secondary | ICD-10-CM | POA: Diagnosis not present

## 2023-12-16 NOTE — Progress Notes (Signed)
   Follow-Up Visit   Subjective  Shelley Sims is a 78 y.o. female who presents for the following: check spot R lower eyelid, noticed in April 2025,   The following portions of the chart were reviewed this encounter and updated as appropriate: medications, allergies, medical history  Review of Systems:  No other skin or systemic complaints except as noted in HPI or Assessment and Plan.  Objective  Well appearing patient in no apparent distress; mood and affect are within normal limits.   A focused examination was performed of the following areas: face  Relevant exam findings are noted in the Assessment and Plan.     Assessment & Plan   ENLARGED MEIBOMIAN GLAND / PLUGGED MEIBOMIAN GLAND R lower eyelid Exam: white pap  Treatment Plan: Benign appearing Cont messaging eyelid Recommend warm compresses Follow up with Ophthalmology   Return for as scheduled for TBSE, Hx of BCC, Hx of Dysplastic nevi, Hx of AKs.  I, Grayce Saunas, RMA, am acting as scribe for Alm Rhyme, MD .   Documentation: I have reviewed the above documentation for accuracy and completeness, and I agree with the above.  Alm Rhyme, MD

## 2023-12-16 NOTE — Patient Instructions (Signed)

## 2023-12-22 ENCOUNTER — Encounter: Payer: Self-pay | Admitting: Dermatology

## 2024-01-10 DIAGNOSIS — G4733 Obstructive sleep apnea (adult) (pediatric): Secondary | ICD-10-CM | POA: Diagnosis not present

## 2024-01-21 DIAGNOSIS — Z1231 Encounter for screening mammogram for malignant neoplasm of breast: Secondary | ICD-10-CM | POA: Diagnosis not present

## 2024-02-10 DIAGNOSIS — G4733 Obstructive sleep apnea (adult) (pediatric): Secondary | ICD-10-CM | POA: Diagnosis not present

## 2024-02-11 ENCOUNTER — Ambulatory Visit: Payer: PPO | Admitting: Dermatology

## 2024-02-11 ENCOUNTER — Encounter: Payer: Self-pay | Admitting: Dermatology

## 2024-02-11 DIAGNOSIS — L409 Psoriasis, unspecified: Secondary | ICD-10-CM | POA: Diagnosis not present

## 2024-02-11 DIAGNOSIS — Z79899 Other long term (current) drug therapy: Secondary | ICD-10-CM

## 2024-02-11 DIAGNOSIS — Z7189 Other specified counseling: Secondary | ICD-10-CM

## 2024-02-11 DIAGNOSIS — D692 Other nonthrombocytopenic purpura: Secondary | ICD-10-CM

## 2024-02-11 DIAGNOSIS — L821 Other seborrheic keratosis: Secondary | ICD-10-CM

## 2024-02-11 DIAGNOSIS — D229 Melanocytic nevi, unspecified: Secondary | ICD-10-CM

## 2024-02-11 DIAGNOSIS — L578 Other skin changes due to chronic exposure to nonionizing radiation: Secondary | ICD-10-CM

## 2024-02-11 DIAGNOSIS — L814 Other melanin hyperpigmentation: Secondary | ICD-10-CM

## 2024-02-11 DIAGNOSIS — W908XXA Exposure to other nonionizing radiation, initial encounter: Secondary | ICD-10-CM

## 2024-02-11 DIAGNOSIS — Z1283 Encounter for screening for malignant neoplasm of skin: Secondary | ICD-10-CM | POA: Diagnosis not present

## 2024-02-11 DIAGNOSIS — D1801 Hemangioma of skin and subcutaneous tissue: Secondary | ICD-10-CM | POA: Diagnosis not present

## 2024-02-11 DIAGNOSIS — L988 Other specified disorders of the skin and subcutaneous tissue: Secondary | ICD-10-CM | POA: Diagnosis not present

## 2024-02-11 DIAGNOSIS — Z85828 Personal history of other malignant neoplasm of skin: Secondary | ICD-10-CM

## 2024-02-11 MED ORDER — MOMETASONE FUROATE 0.1 % EX SOLN: CUTANEOUS | 1 refills | Status: AC

## 2024-02-11 MED ORDER — TRETINOIN 0.025 % EX CREA: TOPICAL_CREAM | Freq: Every day | CUTANEOUS | 5 refills | Status: AC

## 2024-02-11 NOTE — Progress Notes (Signed)
 Follow-Up Visit   Subjective  Shelley Sims is a 78 y.o. female who presents for the following: Skin Cancer Screening and Full Body Skin Exam; hx of BCC and atypical mole. Patient reports no areas of concern today.   The patient presents for Total-Body Skin Exam (TBSE) for skin cancer screening and mole check. The patient has spots, moles and lesions to be evaluated, some may be new or changing and the patient may have concern these could be cancer.  The following portions of the chart were reviewed this encounter and updated as appropriate: medications, allergies, medical history  Review of Systems:  No other skin or systemic complaints except as noted in HPI or Assessment and Plan.  Objective  Well appearing patient in no apparent distress; mood and affect are within normal limits.  A full examination was performed including scalp, head, eyes, ears, nose, lips, neck, chest, axillae, abdomen, back, buttocks, bilateral upper extremities, bilateral lower extremities, hands, feet, fingers, toes, fingernails, and toenails. All findings within normal limits unless otherwise noted below. Exam of nails limited by presence of nail polish.  Relevant physical exam findings are noted in the Assessment and Plan.    Assessment & Plan   SKIN CANCER SCREENING PERFORMED TODAY.  ACTINIC DAMAGE - Chronic condition, secondary to cumulative UV/sun exposure - diffuse scaly erythematous macules with underlying dyspigmentation - Recommend daily broad spectrum sunscreen SPF 30+ to sun-exposed areas, reapply every 2 hours as needed.  - Staying in the shade or wearing long sleeves, sun glasses (UVA+UVB protection) and wide brim hats (4-inch brim around the entire circumference of the hat) are also recommended for sun protection.  - Call for new or changing lesions.  LENTIGINES, SEBORRHEIC KERATOSES, HEMANGIOMAS - Benign normal skin lesions - Benign-appearing - Call for any changes  MELANOCYTIC NEVI -  Tan-brown and/or pink-flesh-colored symmetric macules and papules - Benign appearing on exam today - Observation - Call clinic for new or changing moles - Recommend daily use of broad spectrum spf 30+ sunscreen to sun-exposed areas.   HISTORY OF BASAL CELL CARCINOMA OF THE SKIN - No evidence of recurrence today - Recommend regular full body skin exams - Recommend daily broad spectrum sunscreen SPF 30+ to sun-exposed areas, reapply every 2 hours as needed.  - Call if any new or changing lesions are noted between office visits  PSORIASIS/Sebopsoriasis  Exam: Well-demarcated erythematous papules/plaques with silvery scale, guttate pink scaly papules at the scalp and bilateral ears. 5% BSA. Chronic and persistent condition with duration or expected duration over one year. Condition is symptomatic/ bothersome to patient. Not currently at goal. patient denies joint pain  Psoriasis is a chronic non-curable, but treatable genetic/hereditary disease that may have other systemic features affecting other organ systems such as joints (Psoriatic Arthritis). It is associated with an increased risk of inflammatory bowel disease, heart disease, non-alcoholic fatty liver disease, and depression.  Treatments include light and laser treatments; topical medications; and systemic medications including oral and injectables.  Treatment Plan: - Start Mometasone  0.1% lotion apply topically 5 days a week as needed at the scalp and 3 days a week as needed at the ears  Purpura - Chronic; persistent and recurrent.  Treatable, but not curable. - Violaceous macules and patches - Benign - Related to trauma, age, sun damage and/or use of blood thinners, chronic use of topical and/or oral steroids - Observe - Can use OTC arnica containing moisturizer such as Dermend Bruise Formula if desired - Call for worsening or  other concerns  FACIAL ELASTOSIS Exam: Rhytides and volume loss.  Treatment Plan: -Start Tretinoin   0.025% cream; apply topically 2-3 times weekly at night to dry skin after cleaning. Increase frequency up to nightly as tolerated. - Discussed laser treatment for texture and dark spots - Recommend Cerave daily after removing CPAP equipment  Recommend daily broad spectrum sunscreen SPF 30+ to sun-exposed areas, reapply every 2 hours as needed. Call for new or changing lesions.  Staying in the shade or wearing long sleeves, sun glasses (UVA+UVB protection) and wide brim hats (4-inch brim around the entire circumference of the hat) are also recommended for sun protection.    Return in about 1 year (around 02/10/2025) for TBSE.  I, Emerick Ege, CMA am acting as scribe for Alm Rhyme, MD.   Documentation: I have reviewed the above documentation for accuracy and completeness, and I agree with the above.  Alm Rhyme, MD

## 2024-02-11 NOTE — Patient Instructions (Addendum)
 - Start Mometasone  0.1% lotion apply topically 5 days a week as needed at the scalp and 3 days a week as needed at the ears -Start Tretinoin  0.025% cream; apply topically 2-3 times weekly at night to dry skin after cleaning. Increase frequency up to nightly as tolerated.  Topical steroids (such as triamcinolone , fluocinolone, fluocinonide, mometasone , clobetasol , halobetasol, betamethasone, hydrocortisone) can cause thinning and lightening of the skin if they are used for too long in the same area. Your physician has selected the right strength medicine for your problem and area affected on the body. Please use your medication only as directed by your physician to prevent side effects.     Recommend daily broad spectrum sunscreen SPF 30+ to sun-exposed areas, reapply every 2 hours as needed. Call for new or changing lesions.  Staying in the shade or wearing long sleeves, sun glasses (UVA+UVB protection) and wide brim hats (4-inch brim around the entire circumference of the hat) are also recommended for sun protection.     Melanoma ABCDEs  Melanoma is the most dangerous type of skin cancer, and is the leading cause of death from skin disease.  You are more likely to develop melanoma if you: Have light-colored skin, light-colored eyes, or red or blond hair Spend a lot of time in the sun Tan regularly, either outdoors or in a tanning bed Have had blistering sunburns, especially during childhood Have a close family member who has had a melanoma Have atypical moles or large birthmarks  Early detection of melanoma is key since treatment is typically straightforward and cure rates are extremely high if we catch it early.   The first sign of melanoma is often a change in a mole or a new dark spot.  The ABCDE system is a way of remembering the signs of melanoma.  A for asymmetry:  The two halves do not match. B for border:  The edges of the growth are irregular. C for color:  A mixture of colors  are present instead of an even brown color. D for diameter:  Melanomas are usually (but not always) greater than 6mm - the size of a pencil eraser. E for evolution:  The spot keeps changing in size, shape, and color.  Please check your skin once per month between visits. You can use a small mirror in front and a large mirror behind you to keep an eye on the back side or your body.   If you see any new or changing lesions before your next follow-up, please call to schedule a visit.  Please continue daily skin protection including broad spectrum sunscreen SPF 30+ to sun-exposed areas, reapplying every 2 hours as needed when you're outdoors.     Due to recent changes in healthcare laws, you may see results of your pathology and/or laboratory studies on MyChart before the doctors have had a chance to review them. We understand that in some cases there may be results that are confusing or concerning to you. Please understand that not all results are received at the same time and often the doctors may need to interpret multiple results in order to provide you with the best plan of care or course of treatment. Therefore, we ask that you please give us  2 business days to thoroughly review all your results before contacting the office for clarification. Should we see a critical lab result, you will be contacted sooner.   If You Need Anything After Your Visit  If you have any questions or  concerns for your doctor, please call our main line at [phone] and press option 4 to reach your doctor's medical assistant. If no one answers, please leave a voicemail as directed and we will return your call as soon as possible. Messages left after 4 pm will be answered the following business day.   You may also send us  a message via MyChart. We typically respond to MyChart messages within 1-2 business days.  For prescription refills, please ask your pharmacy to contact our office. Our fax number is  [phone].  If you have an urgent issue when the clinic is closed that cannot wait until the next business day, you can page your doctor at the number below.    Please note that while we do our best to be available for urgent issues outside of office hours, we are not available 24/7.   If you have an urgent issue and are unable to reach us , you may choose to seek medical care at your doctor's office, retail clinic, urgent care center, or emergency room.  If you have a medical emergency, please immediately call 911 or go to the emergency department.  Pager Numbers  - Dr. Hester: [phone]  - Dr. Jackquline: [phone]  - Dr. Claudene: [phone]   - Dr. Raymund: [phone]  In the event of inclement weather, please call our main line at [phone] for an update on the status of any delays or closures.  Dermatology Medication Tips: Please keep the boxes that topical medications come in in order to help keep track of the instructions about where and how to use these. Pharmacies typically print the medication instructions only on the boxes and not directly on the medication tubes.   If your medication is too expensive, please contact our office at [phone] option 4 or send us  a message through MyChart.   We are unable to tell what your co-pay for medications will be in advance as this is different depending on your insurance coverage. However, we may be able to find a substitute medication at lower cost or fill out paperwork to get insurance to cover a needed medication.   If a prior authorization is required to get your medication covered by your insurance company, please allow us  1-2 business days to complete this process.  Drug prices often vary depending on where the prescription is filled and some pharmacies may offer cheaper prices.  The website www.goodrx.com contains coupons for medications through different pharmacies. The prices here do not account for what the cost  may be with help from insurance (it may be cheaper with your insurance), but the website can give you the price if you did not use any insurance.  - You can print the associated coupon and take it with your prescription to the pharmacy.  - You may also stop by our office during regular business hours and pick up a GoodRx coupon card.  - If you need your prescription sent electronically to a different pharmacy, notify our office through [facility] or by phone at [phone] option 4.     Si Usted Necesita Algo Despus de Su Visita  Tambin puede enviarnos un mensaje a travs de Clinical cytogeneticist. Por lo general respondemos a los mensajes de MyChart en el transcurso de 1 a 2 das hbiles.  Para renovar recetas, por favor pida a su farmacia que se ponga en contacto con nuestra oficina. Randi lakes de fax es [place] [phone].  Si tiene un asunto urgente cuando la clnica est  cerrada y que no puede esperar hasta el siguiente da hbil, puede llamar/localizar a su doctor(a) al nmero que aparece a continuacin.   Por favor, tenga en cuenta que aunque hacemos todo lo posible para estar disponibles para asuntos urgentes fuera del horario de [place], no estamos disponibles las 24 horas del da, los 7 [address]  [address] de la [place].   Si tiene un problema urgente y no puede comunicarse con nosotros, puede optar por buscar atencin mdica  en el consultorio de su doctor(a), en una clnica privada, en un centro de atencin urgente o en una sala de emergencias.  Si tiene Engineer, drilling, por favor llame inmediatamente al 911 o vaya a la sala de emergencias.  Nmeros de bper  - Dr. Hester: [phone]  - Dra. Jackquline: [phone]  - Dr. Claudene: [phone]  - Dra. Kitts: [phone]  En caso de inclemencias del [place], por favor llame a nuestra lnea principal al [phone] para una actualizacin sobre el estado de cualquier retraso o cierre.  Consejos para la medicacin en dermatologa: Por  favor, guarde las cajas en las que vienen los medicamentos de uso tpico para ayudarle a seguir las instrucciones sobre dnde y cmo usarlos. Las farmacias generalmente imprimen las instrucciones del medicamento slo en las cajas y no directamente en los tubos del [place].   Si su medicamento es muy caro, por favor, pngase en contacto con landry rieger llamando al [phone] y presione la opcin 4 o envenos un mensaje a travs de Clinical cytogeneticist.   No podemos decirle cul ser su copago por los medicamentos por adelantado ya que esto es diferente dependiendo de la cobertura de su seguro. Sin embargo, es posible que podamos encontrar un medicamento sustituto a Audiological scientist un formulario para que el seguro cubra el medicamento que se considera necesario.   Si se requiere una autorizacin previa para que su compaa de seguros malta su medicamento, por favor permtanos de 1 a 2 das hbiles para completar este proceso.  Los precios de los medicamentos varan con frecuencia dependiendo del Environmental consultant de dnde se surte la receta y alguna farmacias pueden ofrecer precios ms baratos.  El sitio web www.goodrx.com tiene cupones para medicamentos de Health and safety inspector. Los precios aqu no tienen en cuenta lo que podra costar con la ayuda del seguro (puede ser ms barato con su seguro), pero el sitio web puede darle el precio si no utiliz Tourist information centre manager.  - Puede imprimir el cupn correspondiente y llevarlo con su receta a la farmacia.  - Tambin puede pasar por nuestra oficina durante el horario de atencin regular y Education officer, museum una tarjeta de cupones de GoodRx.  - Si necesita que su receta se enve electrnicamente a una farmacia diferente, informe a nuestra oficina a travs de MyChart de [place] o por telfono llamando al [phone] y presione la opcin 4.

## 2024-03-23 DIAGNOSIS — Z1331 Encounter for screening for depression: Secondary | ICD-10-CM | POA: Diagnosis not present

## 2024-03-23 DIAGNOSIS — Z0000 Encounter for general adult medical examination without abnormal findings: Secondary | ICD-10-CM | POA: Diagnosis not present

## 2024-03-23 DIAGNOSIS — F419 Anxiety disorder, unspecified: Secondary | ICD-10-CM | POA: Diagnosis not present

## 2024-03-23 DIAGNOSIS — I10 Essential (primary) hypertension: Secondary | ICD-10-CM | POA: Diagnosis not present

## 2024-03-23 DIAGNOSIS — M2550 Pain in unspecified joint: Secondary | ICD-10-CM | POA: Diagnosis not present

## 2024-03-23 DIAGNOSIS — R42 Dizziness and giddiness: Secondary | ICD-10-CM | POA: Diagnosis not present

## 2024-03-23 DIAGNOSIS — G4733 Obstructive sleep apnea (adult) (pediatric): Secondary | ICD-10-CM | POA: Diagnosis not present

## 2024-03-24 DIAGNOSIS — M2550 Pain in unspecified joint: Secondary | ICD-10-CM | POA: Diagnosis not present

## 2024-04-18 DIAGNOSIS — H43813 Vitreous degeneration, bilateral: Secondary | ICD-10-CM | POA: Diagnosis not present

## 2024-04-18 DIAGNOSIS — H353132 Nonexudative age-related macular degeneration, bilateral, intermediate dry stage: Secondary | ICD-10-CM | POA: Diagnosis not present

## 2024-04-18 DIAGNOSIS — Z961 Presence of intraocular lens: Secondary | ICD-10-CM | POA: Diagnosis not present

## 2024-04-29 DIAGNOSIS — R0602 Shortness of breath: Secondary | ICD-10-CM | POA: Diagnosis not present

## 2024-04-29 DIAGNOSIS — I10 Essential (primary) hypertension: Secondary | ICD-10-CM | POA: Diagnosis not present

## 2024-04-29 DIAGNOSIS — G4733 Obstructive sleep apnea (adult) (pediatric): Secondary | ICD-10-CM | POA: Diagnosis not present

## 2024-04-29 DIAGNOSIS — I471 Supraventricular tachycardia, unspecified: Secondary | ICD-10-CM | POA: Diagnosis not present

## 2024-06-14 ENCOUNTER — Ambulatory Visit
Admission: RE | Admit: 2024-06-14 | Discharge: 2024-06-14 | Disposition: A | Discharge status: Home / Self Care | Attending: Emergency Medicine | Admitting: Emergency Medicine

## 2024-06-14 VITALS — BP 136/72 | HR 75 | Temp 98.1°F | Resp 16

## 2024-06-14 DIAGNOSIS — J069 Acute upper respiratory infection, unspecified: Secondary | ICD-10-CM

## 2024-06-14 MED ORDER — PREDNISONE 10 MG (21) PO TBPK: ORAL_TABLET | Freq: Every day | ORAL | 0 refills | Status: AC

## 2024-06-14 MED ORDER — CEFDINIR 300 MG PO CAPS: 300.0000 mg | ORAL_CAPSULE | Freq: Two times a day (BID) | ORAL | 0 refills | Status: AC

## 2024-06-14 NOTE — Discharge Instructions (Addendum)
 Use the albuterol inhaler as directed.  Take the cefdinir  and prednisone  as directed.  Follow up with your primary care provider.  Go to the emergency department if you have worsening symptoms.

## 2024-06-14 NOTE — ED Provider Notes (Signed)
 " Shelley Sims    CSN: [id number] Arrival date & time: 06/14/24  1531      History   Chief Complaint Chief Complaint  Patient presents with   Cough    Entered by patient    HPI MEMORY HEINRICHS is a 79 y.o. female.  Patient presents with 2-week history of postnasal drainage and cough.  At the onset of her symptoms, she had sneezing and runny nose but the symptoms improved.  She thought she was improving until her symptoms worsened 2 days ago.  She reports tightness in her chest when coughing.  She has been treating her symptoms with Mucinex, Robitussin, albuterol inhaler.  She denies fever, chest pain, shortness of breath.  Her albuterol inhaler was previously prescribed last winter but is still in date and has plenty of activations.  She has not needed to use her albuterol inhaler since last winter when she had similar symptoms.  The history is provided by the patient and medical records.    Past Medical History:  Diagnosis Date   Actinic keratosis    Atypical mole 09/03/2017   right low back 4.0cm lat to spine   BCC (basal cell carcinoma of skin)    L eye, pt is scheduled for [facility] with Dr. Gregorio 03/28/21   History of basal cell carcinoma (BCC) 02/27/2020   right nasal sidewall   Hypertension    Motion sickness    PONV (postoperative nausea and vomiting)    PSVT (paroxysmal supraventricular tachycardia)    Sleep apnea    CPAP    Patient Active Problem List   Diagnosis Date Noted   Obstructive sleep apnea syndrome 04/21/2023   PSVT (paroxysmal supraventricular tachycardia) 04/21/2023   SOB (shortness of breath) on exertion 11/21/2022   Pain in joint of left shoulder 10/29/2022   Anxiety 06/12/2021   Basal cell carcinoma of eye, left 12/04/2020   Encounter for screening colonoscopy    Polyp of transverse colon    Gastroesophageal reflux disease    Gastritis without bleeding    Stiffness of finger joint of left hand 09/22/2019   Stiffness of left hand joint  09/22/2019   Encounter for orthopedic follow-up care 09/08/2019   Pain of left hand 06/02/2019   Trigger thumb of left hand 03/15/2019   Hypertension 03/07/2019   Menopause 03/07/2019   Osteopenia 03/07/2019   Acute pain of right knee 10/12/2018   Osteoarthritis of left shoulder 11/17/2016   Pain of right sacroiliac joint 11/22/2014   Abnormality of gait 11/22/2014   Plantar fibromatosis 11/22/2014    Past Surgical History:  Procedure Laterality Date   CATARACT EXTRACTION W/PHACO Right 08/05/2023   Procedure: CATARACT EXTRACTION PHACO AND INTRAOCULAR LENS PLACEMENT (IOC) RIGHT  CLAREON VIVITY  4.90  00:27.3;  Surgeon: Mittie Gaskin, MD;  Location: [facility];  Service: Ophthalmology;  Laterality: Right;   CATARACT EXTRACTION W/PHACO Left 08/19/2023   Procedure: CATARACT EXTRACTION PHACO AND INTRAOCULAR LENS PLACEMENT (IOC) LEFT  CLAREON VIVITY TORIC 5.58 0:26.4;  Surgeon: Mittie Gaskin, MD;  Location: [facility];  Service: Ophthalmology;  Laterality: Left;   COLONOSCOPY     COLONOSCOPY WITH PROPOFOL  N/A 10/18/2019   Procedure: COLONOSCOPY WITH PROPOFOL ;  Surgeon: Jinny Carmine, MD;  Location: [facility] END[facility];  Service: Endoscopy;  Laterality: N/A;   ESOPHAGOGASTRODUODENOSCOPY (EGD) WITH PROPOFOL  N/A 10/18/2019   Procedure: ESOPHAGOGASTRODUODENOSCOPY (EGD) WITH PROPOFOL ;  Surgeon: Jinny Carmine, MD;  Location: [facility] ENDOSCOPY;  Service: Endoscopy;  Laterality: N/A;   FOOT SURGERY  HAND SURGERY      OB History     Gravida  2   Para  2   Term      Preterm      AB      Living         SAB      IAB      Ectopic      Multiple      Live Births               Home Medications    Prior to Admission medications  Medication Sig Start Date End Date Taking? Authorizing Provider  cefdinir  (OMNICEF ) 300 MG capsule Take 1 capsule (300 mg total) by mouth 2 (two) times daily for 7 days. 06/14/24 06/21/24 Yes Corlis Burnard DEL, NP  predniSONE   (STERAPRED UNI-PAK 21 TAB) 10 MG (21) TBPK tablet Take by mouth daily. As directed 06/14/24  Yes Corlis Burnard DEL, NP  albuterol (VENTOLIN HFA) 108 (90 Base) MCG/ACT inhaler Inhale 2 puffs into the lungs. 05/13/23 05/12/24  [provider]  ALPRAZolam (XANAX) 0.5 MG tablet TAKE ONE-HALF TO ONE TABLET EVERY EIGHT HOURS AS NEEDED 07/13/14   [provider]  AMBULATORY NON FORMULARY MEDICATION Medication Name: Compounded Tretinoin  0.0125%, Niacinamide 2%, Hyalyronic Acid 0.25% Cream ([employer]) Apply pea sized amount nightly to the entire face as directed . 08/19/22   [name], [place] , MD  Calcium Carbonate-Vitamin D 600-200 MG-UNIT TABS Take by mouth.    [provider]  cetirizine (ZYRTEC) 5 MG chewable tablet Chew 5 mg by mouth daily.    [provider]  clobetasol  cream (TEMOVATE ) 0.05 % Apply to skin  twice daily as needed to affected areas for two weeks then apply twice daily on weekends only 09/13/20   Kowalski, David C, MD  Clobetasol  Prop Emollient Base (CLOBETASOL  PROPIONATE E) 0.05 % emollient cream Apply to skin  twice daily as needed to affected areas for two weeks then apply twice daily on weekends only 09/13/20   Kowalski, David C, MD  escitalopram (LEXAPRO) 10 MG tablet  08/28/21   [provider]  meclizine (ANTIVERT) 25 MG tablet Take 25 mg by mouth 3 (three) times daily as needed for dizziness.    [provider]  mometasone  (ELOCON ) 0.1 % cream Apply 1 Application topically as directed. qd up to 5 days a week to aa psoriasis scalp, ears prn flares 02/05/23   Hester Alm BROCKS, MD  mometasone  (ELOCON ) 0.1 % lotion apply topically 5 days a week as needed at the scalp and 3 days a week as needed at the ears 02/11/24   Hester Alm BROCKS, MD  Multiple Vitamins-Minerals (ICAPS AREDS FORMULA PO) Take by mouth in the morning and at bedtime.    [provider]  nitroGLYCERIN  (NITRODUR - DOSED IN MG/24 HR) 0.2 mg/hr patch USE 1/4 PATCH FOR 24  HOURS TO THE AFFECTED AREA 10/12/18   Harvey Seltzer, MD  olmesartan (BENICAR) 20 MG tablet Take 20 mg by mouth daily. 07/11/20   [provider]  tretinoin  (RETIN-A ) 0.025 % cream Apply topically at bedtime. Apply 2-3 times weekly at night to dry skin after cleaning. Increase frequency up to nightly as tolerated. 02/11/24   Hester Alm BROCKS, MD  TURMERIC PO Take by mouth.    [provider]  Zinc Sulfate (ZINC 15 PO) Take by mouth.    [provider]    Family History History reviewed. No pertinent family history.  Social History Social  History[1]   Allergies   Penicillins   Review of Systems Review of Systems  Constitutional:  Negative for chills and fever.  HENT:  Positive for congestion and postnasal drip. Negative for ear pain and sore throat.   Respiratory:  Positive for cough and chest tightness. Negative for shortness of breath.   Cardiovascular:  Negative for chest pain and palpitations.     Physical Exam Triage Vital Signs ED Triage Vitals  Encounter Vitals Group     BP 06/14/24 1606 136/72     Girls Systolic BP Percentile --      Girls Diastolic BP Percentile --      Boys Systolic BP Percentile --      Boys Diastolic BP Percentile --      Pulse Rate 06/14/24 1606 75     Resp 06/14/24 1606 16     Temp 06/14/24 1606 98.1 F (36.7 C)     Temp Source 06/14/24 1606 Oral     SpO2 06/14/24 1606 96 %     Weight --      Height --      Head Circumference --      Peak Flow --      Pain Score 06/14/24 1604 0     Pain Loc --      Pain Education --      Exclude from Growth Chart --    No data found.  Updated Vital Signs BP 136/72 (BP Location: Right Arm)   Pulse 75   Temp 98.1 F (36.7 C) (Oral)   Resp 16   SpO2 96%   Visual Acuity Right Eye Distance:   Left Eye Distance:   Bilateral Distance:    Right Eye Near:   Left Eye Near:    Bilateral Near:     Physical Exam Constitutional:      General: She is not in acute  distress. HENT:     Right Ear: Tympanic membrane normal.     Left Ear: Tympanic membrane normal.     Nose: Nose normal.     Mouth/Throat:     Mouth: Mucous membranes are moist.     Pharynx: Oropharynx is clear.     Comments: PND Cardiovascular:     Rate and Rhythm: Normal rate and regular rhythm.     Heart sounds: Normal heart sounds.  Pulmonary:     Effort: Pulmonary effort is normal. No respiratory distress.     Breath sounds: Normal breath sounds.  Neurological:     Mental Status: She is alert.      UC Treatments / Results  Labs (all labs ordered are listed, but only abnormal results are displayed) Labs Reviewed - No data to display  EKG   Radiology No results found.  Procedures Procedures (including critical care time)  Medications Ordered in UC Medications - No data to display  Initial Impression / Assessment and Plan / UC Course  I have reviewed the triage vital signs and the nursing notes.  Pertinent labs & imaging results that were available during my care of the patient were reviewed by me and considered in my medical decision making (see chart for details).    Acute upper respiratory infection.  Afebrile and vital signs are stable.  Lungs are clear at this time and O2 sat is 96% on room air.  No respiratory distress.  Patient has been symptomatic for 2 weeks with worsening symptoms in the last couple of days.  She is concerned for bronchitis which  she states she gets every winter.  She has an albuterol inhaler that is in date and has plenty of activations.  Treating today with cefdinir  and prednisone .  Patient has taken cefdinir  in the past without difficulty.  She states the last time she took Zithromax  it did not work for her.  Education provided on upper respiratory infection.  Instructed her to follow-up with her PCP.  ED precautions given.  She agrees to plan of care.  Final Clinical Impressions(s) / UC Diagnoses   Final diagnoses:  Acute upper  respiratory infection     Discharge Instructions      Use the albuterol inhaler as directed.  Take the cefdinir  and prednisone  as directed.  Follow up with your primary care provider.  Go to the emergency department if you have worsening symptoms.        ED Prescriptions     Medication Sig Dispense Auth. Provider   cefdinir  (OMNICEF ) 300 MG capsule Take 1 capsule (300 mg total) by mouth 2 (two) times daily for 7 days. 14 capsule Corlis Sor H, NP   predniSONE  (STERAPRED UNI-PAK 21 TAB) 10 MG (21) TBPK tablet Take by mouth daily. As directed 21 tablet Corlis Sor DEL, NP      PDMP not reviewed this encounter.    [1]  Social History Tobacco Use   Smoking status: Former    Types: Cigarettes   Smokeless tobacco: Never   Tobacco comments:    Smoked socially in college  Vaping Use   Vaping status: Never Used  Substance Use Topics   Alcohol use: Yes    Comment: Occasional;   Drug use: Never     Corlis Sor DEL, NP 06/14/24 1641  "

## 2024-06-14 NOTE — ED Triage Notes (Addendum)
 Patient presents to UC for cough since Sunday. States she now developed chest tightness. Hx of bronchitis. Treating with robitussin, mucinex, throat lozenges, and her inhaler. States she had cold-like symptoms last week and tested for Covid and flu both negative.   Denies SOB or fever.

## 2025-02-14 ENCOUNTER — Ambulatory Visit: Admitting: Dermatology
